# Patient Record
Sex: Male | Born: 1985 | Hispanic: No | Marital: Single | Smoking: Current every day smoker
Health system: Southern US, Community
[De-identification: ages and names within clinical notes are randomized; demographics above are authoritative.]

## PROBLEM LIST (undated history)

## (undated) DIAGNOSIS — F319 Bipolar disorder, unspecified: Secondary | ICD-10-CM

## (undated) DIAGNOSIS — N200 Calculus of kidney: Secondary | ICD-10-CM

## (undated) DIAGNOSIS — F32A Depression, unspecified: Secondary | ICD-10-CM

## (undated) DIAGNOSIS — N1330 Unspecified hydronephrosis: Secondary | ICD-10-CM

## (undated) DIAGNOSIS — M109 Gout, unspecified: Secondary | ICD-10-CM

## (undated) HISTORY — PX: OTHER SURGICAL HISTORY: SHX169

---

## 2015-05-12 DIAGNOSIS — F3164 Bipolar disorder, current episode mixed, severe, with psychotic features: Secondary | ICD-10-CM | POA: Insufficient documentation

## 2015-05-12 DIAGNOSIS — F1210 Cannabis abuse, uncomplicated: Secondary | ICD-10-CM | POA: Insufficient documentation

## 2015-05-12 DIAGNOSIS — F319 Bipolar disorder, unspecified: Secondary | ICD-10-CM | POA: Insufficient documentation

## 2015-08-24 DIAGNOSIS — R404 Transient alteration of awareness: Secondary | ICD-10-CM | POA: Insufficient documentation

## 2015-08-24 DIAGNOSIS — T50901A Poisoning by unspecified drugs, medicaments and biological substances, accidental (unintentional), initial encounter: Secondary | ICD-10-CM | POA: Insufficient documentation

## 2015-11-07 DIAGNOSIS — T50902A Poisoning by unspecified drugs, medicaments and biological substances, intentional self-harm, initial encounter: Secondary | ICD-10-CM | POA: Insufficient documentation

## 2015-11-10 DIAGNOSIS — F3130 Bipolar disorder, current episode depressed, mild or moderate severity, unspecified: Secondary | ICD-10-CM | POA: Insufficient documentation

## 2015-11-17 DIAGNOSIS — S069X9A Unspecified intracranial injury with loss of consciousness of unspecified duration, initial encounter: Secondary | ICD-10-CM | POA: Insufficient documentation

## 2015-11-17 DIAGNOSIS — S069XAA Unspecified intracranial injury with loss of consciousness status unknown, initial encounter: Secondary | ICD-10-CM | POA: Insufficient documentation

## 2017-09-23 ENCOUNTER — Emergency Department
Admission: EM | Admit: 2017-09-23 | Discharge: 2017-09-23 | Disposition: A | Discharge status: Home / Self Care | Payer: Self-pay | Attending: Emergency Medicine | Admitting: Emergency Medicine

## 2017-09-23 ENCOUNTER — Other Ambulatory Visit: Payer: Self-pay

## 2017-09-23 ENCOUNTER — Encounter: Payer: Self-pay | Admitting: Emergency Medicine

## 2017-09-23 DIAGNOSIS — R110 Nausea: Secondary | ICD-10-CM | POA: Insufficient documentation

## 2017-09-23 DIAGNOSIS — R638 Other symptoms and signs concerning food and fluid intake: Secondary | ICD-10-CM | POA: Insufficient documentation

## 2017-09-23 DIAGNOSIS — Z5321 Procedure and treatment not carried out due to patient leaving prior to being seen by health care provider: Secondary | ICD-10-CM | POA: Insufficient documentation

## 2017-09-23 DIAGNOSIS — R109 Unspecified abdominal pain: Secondary | ICD-10-CM | POA: Insufficient documentation

## 2017-09-23 LAB — CBC
HCT: 44.9 % (ref 40.0–52.0)
HEMOGLOBIN: 15.9 g/dL (ref 13.0–18.0)
MCH: 32.8 pg (ref 26.0–34.0)
MCHC: 35.5 g/dL (ref 32.0–36.0)
MCV: 92.5 fL (ref 80.0–100.0)
Platelets: 197 10*3/uL (ref 150–440)
RBC: 4.86 MIL/uL (ref 4.40–5.90)
RDW: 13.4 % (ref 11.5–14.5)
WBC: 7.7 10*3/uL (ref 3.8–10.6)

## 2017-09-23 LAB — COMPREHENSIVE METABOLIC PANEL
ALT: 18 U/L (ref 0–44)
ANION GAP: 8 (ref 5–15)
AST: 30 U/L (ref 15–41)
Albumin: 4.1 g/dL (ref 3.5–5.0)
Alkaline Phosphatase: 70 U/L (ref 38–126)
BUN: 12 mg/dL (ref 6–20)
CALCIUM: 8.9 mg/dL (ref 8.9–10.3)
CO2: 24 mmol/L (ref 22–32)
Chloride: 107 mmol/L (ref 98–111)
Creatinine, Ser: 1.03 mg/dL (ref 0.61–1.24)
GFR calc non Af Amer: >60 mL/min (ref >60)
Glucose, Bld: 93 mg/dL (ref 70–99)
Potassium: 3.8 mmol/L (ref 3.5–5.1)
SODIUM: 139 mmol/L (ref 135–145)
TOTAL PROTEIN: 7.1 g/dL (ref 6.5–8.1)
Total Bilirubin: 0.9 mg/dL (ref 0.3–1.2)

## 2017-09-23 LAB — URINALYSIS, COMPLETE (UACMP) WITH MICROSCOPIC
BILIRUBIN URINE: NEGATIVE
Bacteria, UA: NONE SEEN
GLUCOSE, UA: NEGATIVE mg/dL
HGB URINE DIPSTICK: NEGATIVE
Ketones, ur: NEGATIVE mg/dL
Leukocytes, UA: NEGATIVE
NITRITE: NEGATIVE
PH: 6 (ref 5.0–8.0)
Protein, ur: NEGATIVE mg/dL
Specific Gravity, Urine: 1.023 (ref 1.005–1.030)
Squamous Epithelial / LPF: NONE SEEN (ref 0–5)

## 2017-09-23 LAB — LIPASE, BLOOD: LIPASE: 42 U/L (ref 11–51)

## 2017-09-23 NOTE — ED Triage Notes (Signed)
C/O abdominal pain, initially started one week ago.  Pain returned yesterday, and again today.  C/O LUQ pain.  Denies emesis, but c/o nausea and dry heaving.  Alos decreased appetite

## 2017-09-23 NOTE — ED Notes (Signed)
Not seen in the lobby at this time, no answering to name being called

## 2020-05-12 ENCOUNTER — Encounter (HOSPITAL_COMMUNITY): Payer: Self-pay

## 2020-05-12 ENCOUNTER — Emergency Department (HOSPITAL_COMMUNITY): Payer: Self-pay

## 2020-05-12 ENCOUNTER — Other Ambulatory Visit: Payer: Self-pay

## 2020-05-12 ENCOUNTER — Emergency Department (HOSPITAL_COMMUNITY)
Admission: EM | Admit: 2020-05-12 | Discharge: 2020-05-12 | Disposition: A | Discharge status: Home / Self Care | Payer: Self-pay | Attending: Emergency Medicine | Admitting: Emergency Medicine

## 2020-05-12 DIAGNOSIS — N23 Unspecified renal colic: Secondary | ICD-10-CM | POA: Insufficient documentation

## 2020-05-12 DIAGNOSIS — N201 Calculus of ureter: Secondary | ICD-10-CM | POA: Insufficient documentation

## 2020-05-12 DIAGNOSIS — F17210 Nicotine dependence, cigarettes, uncomplicated: Secondary | ICD-10-CM | POA: Insufficient documentation

## 2020-05-12 HISTORY — DX: Calculus of kidney: N20.0

## 2020-05-12 HISTORY — DX: Depression, unspecified: F32.A

## 2020-05-12 HISTORY — DX: Unspecified hydronephrosis: N13.30

## 2020-05-12 HISTORY — DX: Bipolar disorder, unspecified: F31.9

## 2020-05-12 HISTORY — DX: Gout, unspecified: M10.9

## 2020-05-12 LAB — CBC WITH DIFFERENTIAL/PLATELET
Abs Immature Granulocytes: 0.02 10*3/uL (ref 0.00–0.07)
Basophils Absolute: 0.1 10*3/uL (ref 0.0–0.1)
Basophils Relative: 1 %
Eosinophils Absolute: 0.3 10*3/uL (ref 0.0–0.5)
Eosinophils Relative: 3 %
HCT: 46.6 % (ref 39.0–52.0)
Hemoglobin: 15.7 g/dL (ref 13.0–17.0)
Immature Granulocytes: 0 %
Lymphocytes Relative: 42 %
Lymphs Abs: 3.9 10*3/uL (ref 0.7–4.0)
MCH: 31.5 pg (ref 26.0–34.0)
MCHC: 33.7 g/dL (ref 30.0–36.0)
MCV: 93.6 fL (ref 80.0–100.0)
Monocytes Absolute: 0.7 10*3/uL (ref 0.1–1.0)
Monocytes Relative: 8 %
Neutro Abs: 4.2 10*3/uL (ref 1.7–7.7)
Neutrophils Relative %: 46 %
Platelets: 256 10*3/uL (ref 150–400)
RBC: 4.98 MIL/uL (ref 4.22–5.81)
RDW: 13.3 % (ref 11.5–15.5)
WBC: 9.2 10*3/uL (ref 4.0–10.5)
nRBC: 0 % (ref 0.0–0.2)

## 2020-05-12 LAB — URINALYSIS, MICROSCOPIC (REFLEX)
Bacteria, UA: NONE SEEN
RBC / HPF: >50 RBC/hpf (ref 0–5)
Squamous Epithelial / HPF: NONE SEEN (ref 0–5)

## 2020-05-12 LAB — COMPREHENSIVE METABOLIC PANEL
ALT: 16 U/L (ref 0–44)
AST: 19 U/L (ref 15–41)
Albumin: 4.1 g/dL (ref 3.5–5.0)
Alkaline Phosphatase: 69 U/L (ref 38–126)
Anion gap: 9 (ref 5–15)
BUN: 14 mg/dL (ref 6–20)
CO2: 24 mmol/L (ref 22–32)
Calcium: 8.9 mg/dL (ref 8.9–10.3)
Chloride: 106 mmol/L (ref 98–111)
Creatinine, Ser: 0.98 mg/dL (ref 0.61–1.24)
GFR, Estimated: >60 mL/min (ref >60)
Glucose, Bld: 114 mg/dL — ABNORMAL HIGH (ref 70–99)
Potassium: 3.8 mmol/L (ref 3.5–5.1)
Sodium: 139 mmol/L (ref 135–145)
Total Bilirubin: 0.5 mg/dL (ref 0.3–1.2)
Total Protein: 7 g/dL (ref 6.5–8.1)

## 2020-05-12 LAB — URINALYSIS, ROUTINE W REFLEX MICROSCOPIC
Bilirubin Urine: NEGATIVE
Glucose, UA: NEGATIVE mg/dL
Ketones, ur: NEGATIVE mg/dL
Nitrite: NEGATIVE
Protein, ur: NEGATIVE mg/dL
Specific Gravity, Urine: 1.025 (ref 1.005–1.030)
pH: 6 (ref 5.0–8.0)

## 2020-05-12 LAB — LIPASE, BLOOD: Lipase: 37 U/L (ref 11–51)

## 2020-05-12 MED ORDER — OXYCODONE-ACETAMINOPHEN 5-325 MG PO TABS: 1.0000 | ORAL_TABLET | ORAL | 0 refills | Status: DC | PRN

## 2020-05-12 MED ORDER — ONDANSETRON HCL 4 MG/2ML IJ SOLN
4.0000 mg | Freq: Once | INTRAMUSCULAR | Status: AC
  Administered 2020-05-12: 4 mg via INTRAVENOUS
  Filled 2020-05-12: qty 2

## 2020-05-12 MED ORDER — MORPHINE SULFATE (PF) 4 MG/ML IV SOLN
4.0000 mg | Freq: Once | INTRAVENOUS | Status: AC
  Administered 2020-05-12: 4 mg via INTRAVENOUS
  Filled 2020-05-12: qty 1

## 2020-05-12 MED ORDER — LACTATED RINGERS IV BOLUS
1000.0000 mL | Freq: Once | INTRAVENOUS | Status: AC
  Administered 2020-05-12: 1000 mL via INTRAVENOUS

## 2020-05-12 MED ORDER — ONDANSETRON HCL 4 MG PO TABS: 4.0000 mg | ORAL_TABLET | Freq: Four times a day (QID) | ORAL | 0 refills | Status: DC | PRN

## 2020-05-12 MED ORDER — KETOROLAC TROMETHAMINE 30 MG/ML IJ SOLN
30.0000 mg | Freq: Once | INTRAMUSCULAR | Status: AC
  Administered 2020-05-12: 30 mg via INTRAVENOUS
  Filled 2020-05-12: qty 1

## 2020-05-12 MED ORDER — TAMSULOSIN HCL 0.4 MG PO CAPS: 0.4000 mg | ORAL_CAPSULE | Freq: Every day | ORAL | 0 refills | Status: DC

## 2020-05-12 MED ORDER — OXYCODONE HCL 5 MG PO TABS: 5.0000 mg | ORAL_TABLET | ORAL | 0 refills | Status: DC | PRN

## 2020-05-12 NOTE — ED Provider Notes (Signed)
[facility] EMERGENCY DEPARTMENT Provider Note   CSN: [id number] Arrival date & time: 05/12/20  3235   History   James Shelton is a 35 y.o. male.  The history is provided by the patient.    History reviewed. No pertinent past medical history.  There are no problems to display for this patient. He has a history of kidney stones and comes in complaining of left flank pain which woke him from sleep.  Pain does not radiate.  There is associated nausea but no vomiting.  Pain is similar to what he has had with kidney stones in the past.  Nothing makes pain better, nothing makes it worse.  Denies fever or chills.  Denies any difficulty urinating, but does state he noted some blood in his urine before going to bed last night.  Past Surgical History:  Procedure Laterality Date  . arm surgery         History reviewed. No pertinent family history.  Social History   Tobacco Use  . Smoking status: Current Every Day Smoker    Types: Cigarettes  . Smokeless tobacco: Never Used  Substance Use Topics  . Alcohol use: Yes    Comment: rarely  . Drug use: Yes    Types: Marijuana    Home Medications Prior to Admission medications   Not on File    Allergies    Patient has no known allergies.  Review of Systems   Review of Systems  All other systems reviewed and are negative.   Physical Exam Updated Vital Signs BP (!) 140/94 (BP Location: Right Arm)   Pulse 66   Temp 97.9 F (36.6 C) (Oral)   Resp 18   Ht 5\' 11"  (1.803 m)   Wt 79.4 kg   SpO2 99%   BMI 24.41 kg/m   Physical Exam Vitals and nursing note reviewed.   35 year old male, in obvious pain, but in no acute distress. Vital signs are significant for elevated blood pressure. Oxygen saturation is 99%, which is normal. Head is normocephalic and atraumatic. PERRLA, EOMI. Oropharynx is clear. Neck is nontender and supple without adenopathy or JVD. Back is nontender and there is no CVA tenderness. Lungs are clear  without rales, wheezes, or rhonchi. Chest is nontender. Heart has regular rate and rhythm without murmur. Abdomen is soft, flat, with mild left lower quadrant tenderness.  There is no rebound or guarding.  There are no masses or hepatosplenomegaly and peristalsis is hypoactive. Extremities have no cyanosis or edema, full range of motion is present. Skin is warm and dry without rash. Neurologic: Mental status is normal, cranial nerves are intact, there are no motor or sensory deficits.   ED Results / Procedures / Treatments   Labs (all labs ordered are listed, but only abnormal results are displayed) Labs Reviewed  COMPREHENSIVE METABOLIC PANEL - Abnormal; Notable for the following components:      Result Value   Glucose, Bld 114 (*)    All other components within normal limits  LIPASE, BLOOD  CBC WITH DIFFERENTIAL/PLATELET  URINALYSIS, ROUTINE W REFLEX MICROSCOPIC   Radiology CT Renal Stone Study  Result Date: 05/12/2020 CLINICAL DATA:  Left flank pain. EXAM: CT ABDOMEN AND PELVIS WITHOUT CONTRAST TECHNIQUE: Multidetector CT imaging of the abdomen and pelvis was performed following the standard protocol without IV contrast. COMPARISON:  None. FINDINGS: Lower chest: No acute abnormality. Hepatobiliary: No focal liver abnormality is seen. No gallstones, gallbladder wall thickening, or biliary dilatation. Pancreas: Unremarkable. No pancreatic ductal  dilatation or surrounding inflammatory changes. Spleen: Normal in size without focal abnormality. Adrenals/Urinary Tract: Adrenal glands are unremarkable. Kidneys are normal in size without focal lesions. A 4 mm obstructing renal stone is seen within the proximal left ureter with moderate severity left-sided hydronephrosis. A 3 mm calcification is seen projecting over the expected region of the mid right ureter, without associated hydronephrosis or hydroureter (axial CT image 48, CT series number 2). Bladder is unremarkable. Stomach/Bowel: Stomach is  within normal limits. Appendix appears normal. No evidence of bowel wall thickening, distention, or inflammatory changes. A solitary noninflamed diverticulum is seen within the proximal descending colon. Vascular/Lymphatic: No significant vascular findings are present. No enlarged abdominal or pelvic lymph nodes. Reproductive: Prostate is unremarkable. Other: No abdominal wall hernia or abnormality. No abdominopelvic ascites. Musculoskeletal: No acute or significant osseous findings. IMPRESSION: 1. 4 mm obstructing renal stone within the proximal left ureter. 2. Additional 3 mm renal stone suspected within the mid right ureter, as described above. Electronically Signed   By: Aram Candela M.D.   On: 05/12/2020 03:20    Procedures Procedures   Medications Ordered in ED Medications  ketorolac (TORADOL) 30 MG/ML injection 30 mg (has no administration in time range)  morphine 4 MG/ML injection 4 mg (has no administration in time range)  ondansetron (ZOFRAN) injection 4 mg (has no administration in time range)  lactated ringers bolus 1,000 mL (has no administration in time range)    ED Course  I have reviewed the triage vital signs and the nursing notes.  Pertinent labs & imaging results that were available during my care of the patient were reviewed by me and considered in my medical decision making (see chart for details).  MDM Rules/Calculators/A&P Left flank pain concerning for renal colic.  Other diagnostic possibilities include pancreatitis, peptic ulcer disease, diverticulitis, pyelonephritis.  This differential includes conditions with significant morbidity and mortality.  Old records are reviewed, and he has several prior ED visits for kidney stones.  CT abdomen and pelvis without contrast on 04/06/2020 did show mild right hydronephrosis with a 5.3 mm calculus in the proximal right ureter.  He will be given IV fluids, ketorolac, morphine, ondansetron and will be sent for renal stone  protocol CT scan.  CT scan shows 4 mm calculus in the left ureter with hydronephrosis, also probable 3 mm calculus in the right mid ureter without hydronephrosis.  He had partial relief of pain with above-noted treatment, and will be given additional morphine.  He got adequate relief with additional morphine.  He is discharged with prescriptions for oxycodone, tamsulosin, ondansetron and is referred to urology for follow-up.  Return precautions discussed.  Final Clinical Impression(s) / ED Diagnoses Final diagnoses:  Ureteral colic  Ureterolithiasis    Rx / DC Orders ED Discharge Orders         Ordered    oxyCODONE-acetaminophen (PERCOCET) 5-325 MG tablet  Every 4 hours PRN        05/12/20 0456    oxyCODONE (ROXICODONE) 5 MG immediate release tablet  Every 4 hours PRN        05/12/20 0456    ondansetron (ZOFRAN) 4 MG tablet  Every 6 hours PRN        05/12/20 0456    tamsulosin (FLOMAX) 0.4 MG CAPS capsule  Daily        05/12/20 0456           Dione Booze, MD 05/12/20 [phone]

## 2020-05-12 NOTE — Discharge Instructions (Addendum)
Return if your running a fever, if you are vomiting and cannot hold your medication down, or if pain is not being adequately controlled at home.

## 2020-05-12 NOTE — ED Triage Notes (Signed)
Blood in urine since last night. Left flank pain since this am. Hx of kidney stones.

## 2020-05-13 MED FILL — Oxycodone w/ Acetaminophen Tab 5-325 MG: ORAL | Qty: 6 | Status: AC

## 2020-05-22 ENCOUNTER — Inpatient Hospital Stay (HOSPITAL_COMMUNITY): Payer: Self-pay

## 2020-05-22 ENCOUNTER — Observation Stay (HOSPITAL_COMMUNITY)
Admission: EM | Admit: 2020-05-22 | Discharge: 2020-05-23 | Disposition: A | Discharge status: Home / Self Care | Payer: Self-pay | Attending: Urology | Admitting: Urology

## 2020-05-22 ENCOUNTER — Encounter (HOSPITAL_COMMUNITY)
Admission: EM | Disposition: A | Discharge status: Home / Self Care | Payer: Self-pay | Source: Home / Self Care | Attending: Urology

## 2020-05-22 ENCOUNTER — Inpatient Hospital Stay (HOSPITAL_COMMUNITY): Payer: Self-pay | Admitting: Certified Registered Nurse Anesthetist

## 2020-05-22 ENCOUNTER — Emergency Department (HOSPITAL_COMMUNITY): Payer: Self-pay

## 2020-05-22 ENCOUNTER — Other Ambulatory Visit: Payer: Self-pay

## 2020-05-22 ENCOUNTER — Encounter (HOSPITAL_COMMUNITY): Payer: Self-pay

## 2020-05-22 DIAGNOSIS — N201 Calculus of ureter: Secondary | ICD-10-CM | POA: Diagnosis present

## 2020-05-22 DIAGNOSIS — N1330 Unspecified hydronephrosis: Secondary | ICD-10-CM

## 2020-05-22 DIAGNOSIS — Z20822 Contact with and (suspected) exposure to covid-19: Secondary | ICD-10-CM | POA: Insufficient documentation

## 2020-05-22 DIAGNOSIS — F17210 Nicotine dependence, cigarettes, uncomplicated: Secondary | ICD-10-CM | POA: Insufficient documentation

## 2020-05-22 DIAGNOSIS — N3001 Acute cystitis with hematuria: Secondary | ICD-10-CM | POA: Insufficient documentation

## 2020-05-22 DIAGNOSIS — N132 Hydronephrosis with renal and ureteral calculous obstruction: Principal | ICD-10-CM | POA: Insufficient documentation

## 2020-05-22 HISTORY — PX: CYSTOSCOPY W/ URETERAL STENT PLACEMENT: SHX1429

## 2020-05-22 LAB — BASIC METABOLIC PANEL
Anion gap: 8 (ref 5–15)
BUN: 13 mg/dL (ref 6–20)
CO2: 24 mmol/L (ref 22–32)
Calcium: 8.7 mg/dL — ABNORMAL LOW (ref 8.9–10.3)
Chloride: 106 mmol/L (ref 98–111)
Creatinine, Ser: 1.04 mg/dL (ref 0.61–1.24)
GFR, Estimated: >60 mL/min (ref >60)
Glucose, Bld: 71 mg/dL (ref 70–99)
Potassium: 3.9 mmol/L (ref 3.5–5.1)
Sodium: 138 mmol/L (ref 135–145)

## 2020-05-22 LAB — RESP PANEL BY RT-PCR (FLU A&B, COVID) ARPGX2
Influenza A by PCR: NEGATIVE
Influenza B by PCR: NEGATIVE
SARS Coronavirus 2 by RT PCR: NEGATIVE

## 2020-05-22 LAB — CBC
HCT: 50.4 % (ref 39.0–52.0)
Hemoglobin: 16.6 g/dL (ref 13.0–17.0)
MCH: 31.3 pg (ref 26.0–34.0)
MCHC: 32.9 g/dL (ref 30.0–36.0)
MCV: 94.9 fL (ref 80.0–100.0)
Platelets: 239 10*3/uL (ref 150–400)
RBC: 5.31 MIL/uL (ref 4.22–5.81)
RDW: 13.2 % (ref 11.5–15.5)
WBC: 7.8 10*3/uL (ref 4.0–10.5)
nRBC: 0 % (ref 0.0–0.2)

## 2020-05-22 LAB — COMPREHENSIVE METABOLIC PANEL
ALT: 14 U/L (ref 0–44)
AST: 17 U/L (ref 15–41)
Albumin: 4.2 g/dL (ref 3.5–5.0)
Alkaline Phosphatase: 74 U/L (ref 38–126)
Anion gap: 8 (ref 5–15)
BUN: 13 mg/dL (ref 6–20)
CO2: 23 mmol/L (ref 22–32)
Calcium: 9.2 mg/dL (ref 8.9–10.3)
Chloride: 108 mmol/L (ref 98–111)
Creatinine, Ser: 1.06 mg/dL (ref 0.61–1.24)
GFR, Estimated: >60 mL/min (ref >60)
Glucose, Bld: 108 mg/dL — ABNORMAL HIGH (ref 70–99)
Potassium: 4 mmol/L (ref 3.5–5.1)
Sodium: 139 mmol/L (ref 135–145)
Total Bilirubin: 0.8 mg/dL (ref 0.3–1.2)
Total Protein: 7.3 g/dL (ref 6.5–8.1)

## 2020-05-22 LAB — CBC WITH DIFFERENTIAL/PLATELET
Abs Immature Granulocytes: 0.03 10*3/uL (ref 0.00–0.07)
Basophils Absolute: 0.1 10*3/uL (ref 0.0–0.1)
Basophils Relative: 1 %
Eosinophils Absolute: 0.3 10*3/uL (ref 0.0–0.5)
Eosinophils Relative: 2 %
HCT: 49.4 % (ref 39.0–52.0)
Hemoglobin: 16.5 g/dL (ref 13.0–17.0)
Immature Granulocytes: 0 %
Lymphocytes Relative: 29 %
Lymphs Abs: 3.1 10*3/uL (ref 0.7–4.0)
MCH: 31.1 pg (ref 26.0–34.0)
MCHC: 33.4 g/dL (ref 30.0–36.0)
MCV: 93.2 fL (ref 80.0–100.0)
Monocytes Absolute: 1 10*3/uL (ref 0.1–1.0)
Monocytes Relative: 9 %
Neutro Abs: 6.2 10*3/uL (ref 1.7–7.7)
Neutrophils Relative %: 59 %
Platelets: 289 10*3/uL (ref 150–400)
RBC: 5.3 MIL/uL (ref 4.22–5.81)
RDW: 13.1 % (ref 11.5–15.5)
WBC: 10.6 10*3/uL — ABNORMAL HIGH (ref 4.0–10.5)
nRBC: 0 % (ref 0.0–0.2)

## 2020-05-22 LAB — URINALYSIS, ROUTINE W REFLEX MICROSCOPIC
Glucose, UA: 100 mg/dL — AB
Ketones, ur: NEGATIVE mg/dL
Nitrite: POSITIVE — AB
Protein, ur: 100 mg/dL — AB
Specific Gravity, Urine: 1.025 (ref 1.005–1.030)
pH: 6.5 (ref 5.0–8.0)

## 2020-05-22 LAB — URINALYSIS, MICROSCOPIC (REFLEX)
RBC / HPF: >50 RBC/hpf (ref 0–5)
Squamous Epithelial / HPF: NONE SEEN (ref 0–5)

## 2020-05-22 LAB — LIPASE, BLOOD: Lipase: 33 U/L (ref 11–51)

## 2020-05-22 SURGERY — CYSTOSCOPY, WITH RETROGRADE PYELOGRAM AND URETERAL STENT INSERTION
Anesthesia: General | Site: Ureter | Laterality: Bilateral

## 2020-05-22 MED ORDER — HYDROMORPHONE HCL 1 MG/ML IJ SOLN
1.0000 mg | Freq: Once | INTRAMUSCULAR | Status: AC
Start: 2020-05-22 — End: 2020-05-22
  Administered 2020-05-22: 1 mg via INTRAVENOUS
  Filled 2020-05-22: qty 1

## 2020-05-22 MED ORDER — OXYCODONE-ACETAMINOPHEN 5-325 MG PO TABS: 1.0000 | ORAL_TABLET | ORAL | Status: DC | PRN

## 2020-05-22 MED ORDER — GLYCOPYRROLATE PF 0.2 MG/ML IJ SOSY
PREFILLED_SYRINGE | INTRAMUSCULAR | Status: AC
  Filled 2020-05-22: qty 3

## 2020-05-22 MED ORDER — STERILE WATER FOR IRRIGATION IR SOLN
Status: DC | PRN
  Administered 2020-05-22: 500 mL

## 2020-05-22 MED ORDER — DIATRIZOATE MEGLUMINE 30 % UR SOLN
URETHRAL | Status: AC
  Filled 2020-05-22: qty 100

## 2020-05-22 MED ORDER — LIDOCAINE HCL (PF) 2 % IJ SOLN
INTRAMUSCULAR | Status: AC
  Filled 2020-05-22: qty 5

## 2020-05-22 MED ORDER — ZOLPIDEM TARTRATE 5 MG PO TABS: 5.0000 mg | ORAL_TABLET | Freq: Every evening | ORAL | Status: DC | PRN

## 2020-05-22 MED ORDER — GLYCOPYRROLATE 0.2 MG/ML IJ SOLN
INTRAMUSCULAR | Status: DC | PRN
  Administered 2020-05-22: .1 mg via INTRAVENOUS

## 2020-05-22 MED ORDER — LIDOCAINE HCL (CARDIAC) PF 100 MG/5ML IV SOSY
PREFILLED_SYRINGE | INTRAVENOUS | Status: DC | PRN
  Administered 2020-05-22: 40 mg via INTRAVENOUS

## 2020-05-22 MED ORDER — ONDANSETRON HCL 4 MG/2ML IJ SOLN: 4.0000 mg | Freq: Once | INTRAMUSCULAR | Status: DC | PRN

## 2020-05-22 MED ORDER — SODIUM CHLORIDE 0.9 % IV SOLN
1.0000 g | Freq: Once | INTRAVENOUS | Status: AC
  Administered 2020-05-22: 1 g via INTRAVENOUS
  Filled 2020-05-22: qty 10

## 2020-05-22 MED ORDER — SODIUM CHLORIDE 0.9 % IR SOLN
Status: DC | PRN
  Administered 2020-05-22: 3000 mL via INTRAVESICAL

## 2020-05-22 MED ORDER — FENTANYL CITRATE (PF) 100 MCG/2ML IJ SOLN
INTRAMUSCULAR | Status: AC
  Filled 2020-05-22: qty 2

## 2020-05-22 MED ORDER — SODIUM CHLORIDE 0.9 % IV SOLN
2.0000 g | INTRAVENOUS | Status: DC
  Administered 2020-05-23: 2 g via INTRAVENOUS
  Filled 2020-05-22: qty 20

## 2020-05-22 MED ORDER — KETOROLAC TROMETHAMINE 30 MG/ML IJ SOLN
30.0000 mg | Freq: Once | INTRAMUSCULAR | Status: AC
  Administered 2020-05-22: 30 mg via INTRAVENOUS
  Filled 2020-05-22: qty 1

## 2020-05-22 MED ORDER — PROPOFOL 10 MG/ML IV BOLUS
INTRAVENOUS | Status: DC | PRN
  Administered 2020-05-22: 200 mg via INTRAVENOUS

## 2020-05-22 MED ORDER — DIATRIZOATE MEGLUMINE 30 % UR SOLN
URETHRAL | Status: DC | PRN
  Administered 2020-05-22: 12 mL via URETHRAL

## 2020-05-22 MED ORDER — DIPHENHYDRAMINE HCL 12.5 MG/5ML PO ELIX: 12.5000 mg | ORAL_SOLUTION | Freq: Four times a day (QID) | ORAL | Status: DC | PRN

## 2020-05-22 MED ORDER — CHLORHEXIDINE GLUCONATE 0.12 % MT SOLN
15.0000 mL | Freq: Once | OROMUCOSAL | Status: AC
  Administered 2020-05-22: 15 mL via OROMUCOSAL

## 2020-05-22 MED ORDER — OXYBUTYNIN CHLORIDE 5 MG PO TABS
5.0000 mg | ORAL_TABLET | Freq: Three times a day (TID) | ORAL | Status: DC | PRN
  Administered 2020-05-22: 5 mg via ORAL
  Filled 2020-05-22: qty 1

## 2020-05-22 MED ORDER — MORPHINE SULFATE (PF) 4 MG/ML IV SOLN
4.0000 mg | Freq: Once | INTRAVENOUS | Status: AC
  Administered 2020-05-22: 4 mg via INTRAVENOUS
  Filled 2020-05-22: qty 1

## 2020-05-22 MED ORDER — PROPOFOL 10 MG/ML IV BOLUS
INTRAVENOUS | Status: AC
  Filled 2020-05-22: qty 20

## 2020-05-22 MED ORDER — HYDROMORPHONE HCL 1 MG/ML IJ SOLN: 0.2500 mg | INTRAMUSCULAR | Status: DC | PRN

## 2020-05-22 MED ORDER — ONDANSETRON HCL 4 MG/2ML IJ SOLN
INTRAMUSCULAR | Status: DC | PRN
  Administered 2020-05-22: 4 mg via INTRAVENOUS

## 2020-05-22 MED ORDER — HYDROMORPHONE HCL 1 MG/ML IJ SOLN
0.5000 mg | INTRAMUSCULAR | Status: DC | PRN
  Administered 2020-05-22 (×2): 1 mg via INTRAVENOUS
  Filled 2020-05-22 (×2): qty 1

## 2020-05-22 MED ORDER — SODIUM CHLORIDE 0.9 % IV SOLN: INTRAVENOUS | Status: DC

## 2020-05-22 MED ORDER — LACTATED RINGERS IV SOLN: INTRAVENOUS | Status: DC

## 2020-05-22 MED ORDER — ORAL CARE MOUTH RINSE: 15.0000 mL | Freq: Once | OROMUCOSAL | Status: AC

## 2020-05-22 MED ORDER — ACETAMINOPHEN 325 MG PO TABS
650.0000 mg | ORAL_TABLET | ORAL | Status: DC | PRN
  Administered 2020-05-22: 650 mg via ORAL
  Filled 2020-05-22: qty 2

## 2020-05-22 MED ORDER — MIDAZOLAM HCL 2 MG/2ML IJ SOLN
INTRAMUSCULAR | Status: DC | PRN
  Administered 2020-05-22: 2 mg via INTRAVENOUS

## 2020-05-22 MED ORDER — ONDANSETRON HCL 4 MG/2ML IJ SOLN: 4.0000 mg | INTRAMUSCULAR | Status: DC | PRN

## 2020-05-22 MED ORDER — FENTANYL CITRATE (PF) 250 MCG/5ML IJ SOLN
INTRAMUSCULAR | Status: DC | PRN
  Administered 2020-05-22: 50 ug via INTRAVENOUS

## 2020-05-22 MED ORDER — MIDAZOLAM HCL 2 MG/2ML IJ SOLN
INTRAMUSCULAR | Status: AC
  Filled 2020-05-22: qty 2

## 2020-05-22 MED ORDER — ONDANSETRON HCL 4 MG/2ML IJ SOLN
4.0000 mg | Freq: Once | INTRAMUSCULAR | Status: AC
  Administered 2020-05-22: 4 mg via INTRAVENOUS
  Filled 2020-05-22: qty 2

## 2020-05-22 MED ORDER — DIPHENHYDRAMINE HCL 50 MG/ML IJ SOLN
12.5000 mg | Freq: Four times a day (QID) | INTRAMUSCULAR | Status: DC | PRN
Start: 2020-05-22 — End: 2020-05-23

## 2020-05-22 MED ORDER — ONDANSETRON HCL 4 MG/2ML IJ SOLN
INTRAMUSCULAR | Status: AC
  Filled 2020-05-22: qty 2

## 2020-05-22 SURGICAL SUPPLY — 20 items
BAG DRAIN URO TABLE W/ADPT NS (BAG) ×2 IMPLANT
BAG DRN 8 ADPR NS SKTRN CSTL (BAG) ×1
BAG HAMPER (MISCELLANEOUS) ×2 IMPLANT
CATH INTERMIT  6FR 70CM (CATHETERS) ×2 IMPLANT
CLOTH BEACON ORANGE TIMEOUT ST (SAFETY) ×2 IMPLANT
DECANTER SPIKE VIAL GLASS SM (MISCELLANEOUS) ×2 IMPLANT
GLOVE BIO SURGEON STRL SZ8 (GLOVE) ×2 IMPLANT
GLOVE SURG UNDER POLY LF SZ7 (GLOVE) ×4 IMPLANT
GOWN STRL REUS W/TWL LRG LVL3 (GOWN DISPOSABLE) ×2 IMPLANT
GOWN STRL REUS W/TWL XL LVL3 (GOWN DISPOSABLE) ×2 IMPLANT
GUIDEWIRE STR ZIPWIRE 035X150 (MISCELLANEOUS) ×2 IMPLANT
IV NS IRRIG 3000ML ARTHROMATIC (IV SOLUTION) ×4 IMPLANT
KIT TURNOVER CYSTO (KITS) ×2 IMPLANT
MANIFOLD NEPTUNE II (INSTRUMENTS) ×2 IMPLANT
PACK CYSTO (CUSTOM PROCEDURE TRAY) ×2 IMPLANT
PAD ARMBOARD 7.5X6 YLW CONV (MISCELLANEOUS) ×4 IMPLANT
STENT URET 6FRX26 CONTOUR (STENTS) ×4 IMPLANT
SYR 10ML LL (SYRINGE) ×2 IMPLANT
TOWEL OR 17X26 4PK STRL BLUE (TOWEL DISPOSABLE) ×2 IMPLANT
WATER STERILE IRR 500ML POUR (IV SOLUTION) ×2 IMPLANT

## 2020-05-22 NOTE — Anesthesia Procedure Notes (Signed)
Procedure Name: LMA Insertion Date/Time: 05/22/2020 12:48 PM Performed by: Lorin Glass, CRNA Pre-anesthesia Checklist: Patient identified, Emergency Drugs available, Suction available and Patient being monitored Patient Re-evaluated:Patient Re-evaluated prior to induction Oxygen Delivery Method: Circle system utilized Preoxygenation: Pre-oxygenation with 100% oxygen Induction Type: IV induction LMA: LMA inserted LMA Size: 4.0 Tube secured with: Tape Dental Injury: Teeth and Oropharynx as per pre-operative assessment

## 2020-05-22 NOTE — Op Note (Signed)
Preoperative diagnosis: bilateral ureteral calculi  Postoperative diagnosis: Same  Procedure: 1 cystoscopy 2. Bilateral retrograde pyelography 3.  Intraoperative fluoroscopy, under one hour, with interpretation 4.  bilateral 6 x 26 JJ stent placement  Attending: Wilkie Aye  Anesthesia: General  Estimated blood loss: None  Drains: bilateral 6 x 26 JJ ureteral stent without tether  Specimens: none  Antibiotics: rocephin  Findings: bilateral ureteral stones. moderate bilateral hydronephrosis. No masses/lesions in the bladder. Ureteral orifices in normal anatomic location.  Indications: Patient is a 35 year old male with a history of bilateral ureteral stone and pyuria. After discussing treatment options, they decided proceed with bilateral stent placement.  Procedure her in detail: The patient was brought to the operating room and a brief timeout was done to ensure correct patient, correct procedure, correct site.  General anesthesia was administered patient was placed in dorsal lithotomy position.  Her genitalia was then prepped and draped in usual sterile fashion.  A rigid 22 French cystoscope was passed in the urethra and the bladder.  Bladder was inspected free masses or lesions.  the ureteral orifices were in the normal orthotopic locations. a 6 french ureteral catheter was then instilled into the left ureteral orifice.  a gentle retrograde was obtained and findings noted above. We then advanced a zipwire up to the renal pelvis. We then placed a 6 x 26 double-j ureteral stent over the zip wire. We then removed the wire and good coil was noted in the the renal pelvis under fluoroscopy and the bladder under direct vision.  We then turned out attention to the right side.  a gentle retrograde was obtained and findings noted above. We then advanced a zipwire up to the renal pelvis. we then placed a 6 x 26 double-j ureteral stent over the original zip wire.  We then removed the wire and  good coil was noted in the the renal pelvis under fluoroscopy and the bladder under direct vision.  the bladder was then drained and this concluded the procedure which was well tolerated by patient.  Complications: None  Condition: Stable, extubated, transferred to PACU  Plan: Patient is to be admitted for IV antibiotics

## 2020-05-22 NOTE — Transfer of Care (Signed)
Immediate Anesthesia Transfer of Care Note  Patient: James Shelton  Procedure(s) Performed: CYSTOSCOPY WITH RETROGRADE PYELOGRAM/URETERAL STENT PLACEMENT (Bilateral Ureter)  Patient Location: PACU  Anesthesia Type:General  Level of Consciousness: drowsy  Airway & Oxygen Therapy: Patient Spontanous Breathing and Patient connected to nasal cannula oxygen  Post-op Assessment: Report given to RN and Post -op Vital signs reviewed and stable  Post vital signs: Reviewed and stable  Last Vitals:  Vitals Value Taken Time  BP 95/64   Temp    Pulse 45   Resp 10   SpO2 97     Last Pain:  Vitals:   05/22/20 1151  TempSrc: Oral  PainSc: 0-No pain         Complications: No complications documented.

## 2020-05-22 NOTE — Anesthesia Preprocedure Evaluation (Signed)
Anesthesia Evaluation  Patient identified by MRN, date of birth, ID band Patient awake    Reviewed: Allergy & Precautions, H&P , NPO status , Patient's Chart, lab work & pertinent test results, reviewed documented beta blocker date and time   Airway Mallampati: II  TM Distance: >3 FB Neck ROM: full    Dental no notable dental hx.    Pulmonary neg pulmonary ROS, Current Smoker,    Pulmonary exam normal breath sounds clear to auscultation       Cardiovascular Exercise Tolerance: Good negative cardio ROS   Rhythm:regular Rate:Normal     Neuro/Psych PSYCHIATRIC DISORDERS Depression Bipolar Disorder negative neurological ROS     GI/Hepatic negative GI ROS, Neg liver ROS,   Endo/Other  negative endocrine ROS  Renal/GU Renal disease  negative genitourinary   Musculoskeletal   Abdominal   Peds  Hematology negative hematology ROS (+)   Anesthesia Other Findings   Reproductive/Obstetrics negative OB ROS                             Anesthesia Physical  Anesthesia Plan  ASA: II  Anesthesia Plan: General   Post-op Pain Management:    Induction:   PONV Risk Score and Plan: Ondansetron  Airway Management Planned:   Additional Equipment:   Intra-op Plan:   Post-operative Plan:   Informed Consent: I have reviewed the patients History and Physical, chart, labs and discussed the procedure including the risks, benefits and alternatives for the proposed anesthesia with the patient or authorized representative who has indicated his/her understanding and acceptance.     Dental Advisory Given  Plan Discussed with: CRNA  Anesthesia Plan Comments:         Anesthesia Quick Evaluation  

## 2020-05-22 NOTE — ED Notes (Signed)
Patient resting comfortably - checked on patient due to low pulse rate.

## 2020-05-22 NOTE — H&P (View-Only) (Signed)
Urology Consult  Referring physician: Dr. Jacqulyn Bath Reason for referral: Bilateral ureteral calculi  Chief Complaint: bilateral flank pain  History of Present Illness: Mr Soy is a 35yo who presented to the ER with worsening bilateral flank pain and nausea/vomiting. He was originally seen on 05/12/2020 which bilateral ureteral calculi and was discharged and referred to urology as an outpatient. The pain worsened yesterday and he presented back to the ER. Repeat CT shows bilateral ureteral calculi. He is having hematuria but no other LUTS. No fevers. UA shows many bacteria  Past Medical History:  Diagnosis Date  . Bipolar 1 disorder (HCC)   . Depression   . Gout   . Hydronephrosis   . Kidney stone    Past Surgical History:  Procedure Laterality Date  . arm surgery      Medications: I have reviewed the patient's current medications. Allergies:  Allergies  Allergen Reactions  . Depakote [Valproic Acid]     History reviewed. No pertinent family history. Social History:  reports that he has been smoking cigarettes. He has never used smokeless tobacco. He reports current alcohol use. He reports current drug use. Drug: Marijuana.  Review of Systems  Genitourinary: Positive for flank pain.  All other systems reviewed and are negative.   Physical Exam:  Vital signs in last 24 hours: Temp:  [97.9 F (36.6 C)] 97.9 F (36.6 C) (04/21 0504) Pulse Rate:  [66-76] 66 (04/21 0600) Resp:  [22] 22 (04/21 0505) BP: (138-152)/(91-117) 138/91 (04/21 0506) SpO2:  [95 %-97 %] 95 % (04/21 0600) Weight:  [79.4 kg] 79.4 kg (04/21 0504) Physical Exam Vitals reviewed.  Constitutional:      Appearance: Normal appearance.  HENT:     Head: Normocephalic and atraumatic.     Nose: Nose normal. No congestion.  Eyes:     Extraocular Movements: Extraocular movements intact.     Pupils: Pupils are equal, round, and reactive to light.  Cardiovascular:     Rate and Rhythm: Normal rate and regular  rhythm.  Pulmonary:     Effort: Pulmonary effort is normal. No respiratory distress.  Abdominal:     General: Abdomen is flat. There is no distension.  Musculoskeletal:        General: No swelling. Normal range of motion.     Cervical back: Normal range of motion and neck supple.  Skin:    General: Skin is warm and dry.  Neurological:     General: No focal deficit present.     Mental Status: He is alert and oriented to person, place, and time.  Psychiatric:        Mood and Affect: Mood normal.        Behavior: Behavior normal.        Thought Content: Thought content normal.        Judgment: Judgment normal.     Laboratory Data:  Results for orders placed or performed during the hospital encounter of 05/22/20 (from the past 72 hour(s))  Urinalysis, Routine w reflex microscopic Urine, Clean Catch     Status: Abnormal   Collection Time: 05/22/20  5:15 AM  Result Value Ref Range   Color, Urine BROWN (A) YELLOW    Comment: BIOCHEMICALS MAY BE AFFECTED BY COLOR   APPearance HAZY (A) CLEAR   Specific Gravity, Urine 1.025 1.005 - 1.030   pH 6.5 5.0 - 8.0   Glucose, UA 100 (A) NEGATIVE mg/dL   Hgb urine dipstick LARGE (A) NEGATIVE   Bilirubin Urine SMALL (A)  NEGATIVE   Ketones, ur NEGATIVE NEGATIVE mg/dL   Protein, ur 606 (A) NEGATIVE mg/dL   Nitrite POSITIVE (A) NEGATIVE   Leukocytes,Ua TRACE (A) NEGATIVE    Comment: Performed at [facility], [address] [place], [place] [address]  Comprehensive metabolic panel     Status: Abnormal   Collection Time: 05/22/20  5:15 AM  Result Value Ref Range   Sodium 139 135 - 145 mmol/L   Potassium 4.0 3.5 - 5.1 mmol/L   Chloride 108 98 - 111 mmol/L   CO2 23 22 - 32 mmol/L   Glucose, Bld 108 (H) 70 - 99 mg/dL    Comment: Glucose reference range applies only to samples taken after fasting for at least 8 hours.   BUN 13 6 - 20 mg/dL   Creatinine, Ser 1.09 0.61 - 1.24 mg/dL   Calcium 9.2 8.9 - 32.3 mg/dL   Total Protein 7.3 6.5 - 8.1 g/dL    Albumin 4.2 3.5 - 5.0 g/dL   AST 17 15 - 41 U/L   ALT 14 0 - 44 U/L   Alkaline Phosphatase 74 38 - 126 U/L   Total Bilirubin 0.8 0.3 - 1.2 mg/dL   GFR, Estimated >55 >73 mL/min    Comment: (NOTE) Calculated using the CKD-EPI Creatinine Equation (2021)    Anion gap 8 5 - 15    Comment: Performed at [facility], [address] [place], [place] [address]  CBC with Differential     Status: Abnormal   Collection Time: 05/22/20  5:15 AM  Result Value Ref Range   WBC 10.6 (H) 4.0 - 10.5 K/uL   RBC 5.30 4.22 - 5.81 MIL/uL   Hemoglobin 16.5 13.0 - 17.0 g/dL   HCT 42.7 06.2 - 37.6 %   MCV 93.2 80.0 - 100.0 fL   MCH 31.1 26.0 - 34.0 pg   MCHC 33.4 30.0 - 36.0 g/dL   RDW 28.3 15.1 - 76.1 %   Platelets 289 150 - 400 K/uL   nRBC 0.0 0.0 - 0.2 %   Neutrophils Relative % 59 %   Neutro Abs 6.2 1.7 - 7.7 K/uL   Lymphocytes Relative 29 %   Lymphs Abs 3.1 0.7 - 4.0 K/uL   Monocytes Relative 9 %   Monocytes Absolute 1.0 0.1 - 1.0 K/uL   Eosinophils Relative 2 %   Eosinophils Absolute 0.3 0.0 - 0.5 K/uL   Basophils Relative 1 %   Basophils Absolute 0.1 0.0 - 0.1 K/uL   Immature Granulocytes 0 %   Abs Immature Granulocytes 0.03 0.00 - 0.07 K/uL    Comment: Performed at [facility], [address], [place], [place] [address]  Lipase, blood     Status: None   Collection Time: 05/22/20  5:15 AM  Result Value Ref Range   Lipase 33 11 - 51 U/L    Comment: Performed at [facility], [address], [place], [place] [address]  Urinalysis, Microscopic (reflex)     Status: Abnormal   Collection Time: 05/22/20  5:15 AM  Result Value Ref Range   RBC / HPF >50 0 - 5 RBC/hpf   WBC, UA 0-5 0 - 5 WBC/hpf   Bacteria, UA MANY (A) NONE SEEN   Squamous Epithelial / LPF NONE SEEN 0 - 5    Comment: Performed at [facility], [address], [place], [place] [address]  Resp Panel by RT-PCR (Flu A&B, Covid) Nasopharyngeal Swab     Status: None   Collection Time: 05/22/20  6:50 AM   Specimen: Nasopharyngeal  Swab; Nasopharyngeal(NP) swabs in vial transport medium  Result Value Ref Range   SARS Coronavirus 2 by RT PCR NEGATIVE NEGATIVE    Comment: (NOTE) SARS-CoV-2 target nucleic acids are NOT DETECTED.  The SARS-CoV-2 RNA is generally detectable in upper respiratory specimens during the acute phase of infection. The lowest concentration of SARS-CoV-2 viral copies this assay can detect is 138 copies/mL. A negative result does not preclude SARS-Cov-2 infection and should not be used as the sole basis for treatment or other patient management decisions. A negative result may occur with  improper specimen collection/handling, submission of specimen other than nasopharyngeal swab, presence of viral mutation(s) within the areas targeted by this assay, and inadequate number of viral copies(<138 copies/mL). A negative result must be combined with clinical observations, patient history, and epidemiological information. The expected result is Negative.  Fact Sheet for Patients:  https://www.fda.gov/media/152166/download  Fact Sheet for Healthcare Providers:  https://www.fda.gov/media/152162/download  This test is no t yet approved or cleared by the [place] FDA and  has been authorized for detection and/or diagnosis of SARS-CoV-2 by FDA under an Emergency Use Authorization (EUA). This EUA will remain  in effect (meaning this test can be used) for the duration of the COVID-19 declaration under Section 564(b)(1) of the Act, 21 U.S.C.section 360bbb-3(b)(1), unless the authorization is terminated  or revoked sooner.       Influenza A by PCR NEGATIVE NEGATIVE   Influenza B by PCR NEGATIVE NEGATIVE    Comment: (NOTE) The Xpert Xpress SARS-CoV-2/FLU/RSV plus assay is intended as an aid in the diagnosis of influenza from Nasopharyngeal swab specimens and should not be used as a sole basis for treatment. Nasal washings and aspirates are unacceptable for Xpert Xpress  SARS-CoV-2/FLU/RSV testing.  Fact Sheet for Patients: https://www.fda.gov/media/152166/download  Fact Sheet for Healthcare Providers: https://www.fda.gov/media/152162/download  This test is not yet approved or cleared by the [place] FDA and has been authorized for detection and/or diagnosis of SARS-CoV-2 by FDA under an Emergency Use Authorization (EUA). This EUA will remain in effect (meaning this test can be used) for the duration of the COVID-19 declaration under Section 564(b)(1) of the Act, 21 U.S.C. section 360bbb-3(b)(1), unless the authorization is terminated or revoked.  Performed at [facility], [address] [place], [place] [address]    Recent Results (from the past 240 hour(s))  Resp Panel by RT-PCR (Flu A&B, Covid) Nasopharyngeal Swab     Status: None   Collection Time: 05/22/20  6:50 AM   Specimen: Nasopharyngeal Swab; Nasopharyngeal(NP) swabs in vial transport medium  Result Value Ref Range Status   SARS Coronavirus 2 by RT PCR NEGATIVE NEGATIVE Final    Comment: (NOTE) SARS-CoV-2 target nucleic acids are NOT DETECTED.  The SARS-CoV-2 RNA is generally detectable in upper respiratory specimens during the acute phase of infection. The lowest concentration of SARS-CoV-2 viral copies this assay can detect is 138 copies/mL. A negative result does not preclude SARS-Cov-2 infection and should not be used as the sole basis for treatment or other patient management decisions. A negative result may occur with  improper specimen collection/handling, submission of specimen other than nasopharyngeal swab, presence of viral mutation(s) within the areas targeted by this assay, and inadequate number of viral copies(<138 copies/mL). A negative result must be combined with clinical observations, patient history, and epidemiological information. The expected result is Negative.  Fact Sheet for Patients:  https://www.fda.gov/media/152166/download  Fact Sheet for  Healthcare Providers:  https://www.fda.gov/media/152162/download  This test   is no t yet approved or cleared by the [place] and  has been authorized for detection and/or diagnosis of SARS-CoV-2 by FDA under an Emergency Use Authorization (EUA). This EUA will remain  in effect (meaning this test can be used) for the duration of the COVID-19 declaration under Section 564(b)(1) of the Act, 21 U.S.C.section 360bbb-3(b)(1), unless the authorization is terminated  or revoked sooner.       Influenza A by PCR NEGATIVE NEGATIVE Final   Influenza B by PCR NEGATIVE NEGATIVE Final    Comment: (NOTE) The Xpert Xpress SARS-CoV-2/FLU/RSV plus assay is intended as an aid in the diagnosis of influenza from Nasopharyngeal swab specimens and should not be used as a sole basis for treatment. Nasal washings and aspirates are unacceptable for Xpert Xpress SARS-CoV-2/FLU/RSV testing.  Fact Sheet for Patients: BloggerCourse.com  Fact Sheet for Healthcare Providers: SeriousBroker.it  This test is not yet approved or cleared by the [place] FDA and has been authorized for detection and/or diagnosis of SARS-CoV-2 by FDA under an Emergency Use Authorization (EUA). This EUA will remain in effect (meaning this test can be used) for the duration of the COVID-19 declaration under Section 564(b)(1) of the Act, 21 U.S.C. section 360bbb-3(b)(1), unless the authorization is terminated or revoked.  Performed at [facility], [address], [place], [place] [address]    Creatinine: Recent Labs    05/22/20 0515  CREATININE 1.06   Baseline Creatinine: 1  Impression/Assessment:  35yo with bilateral ureteral calculi  Plan:  -We discussed the management of kidney stones. These options include observation, ureteroscopy, shockwave lithotripsy (ESWL) and percutaneous nephrolithotomy (PCNL). We discussed which options are relevant to the patient's  stone(s). We discussed the natural history of kidney stones as well as the complications of untreated stones and the impact on quality of life without treatment as well as with each of the above listed treatments. We also discussed the efficacy of each treatment in its ability to clear the stone burden. With any of these management options I discussed the signs and symptoms of infection and the need for emergent treatment should these be experienced. For each option we discussed the ability of each procedure to clear the patient of their stone burden.   For observation I described the risks which include but are not limited to silent renal damage, life-threatening infection, need for emergent surgery, failure to pass stone and pain.   For ureteroscopy I described the risks which include bleeding, infection, damage to contiguous structures, positioning injury, ureteral stricture, ureteral avulsion, ureteral injury, need for prolonged ureteral stent, inability to perform ureteroscopy, need for an interval procedure, inability to clear stone burden, stent discomfort/pain, heart attack, stroke, pulmonary embolus and the inherent risks with general anesthesia.   For shockwave lithotripsy I described the risks which include arrhythmia, kidney contusion, kidney hemorrhage, need for transfusion, pain, inability to adequately break up stone, inability to pass stone fragments, Steinstrasse, infection associated with obstructing stones, need for alternate surgical procedure, need for repeat shockwave lithotripsy, MI, CVA, PE and the inherent risks with anesthesia/conscious sedation.   For PCNL I described the risks including positioning injury, pneumothorax, hydrothorax, need for chest tube, inability to clear stone burden, renal laceration, arterial venous fistula or malformation, need for embolization of kidney, loss of kidney or renal function, need for repeat procedure, need for prolonged nephrostomy tube, ureteral  avulsion, MI, CVA, PE and the inherent risks of general anesthesia.   - The patient would like to proceed with  bilateral ureteral stent placement  Wilkie Ayeatrick Keyna Blizard 05/22/2020, 8:23 AM

## 2020-05-22 NOTE — Anesthesia Postprocedure Evaluation (Signed)
Anesthesia Post Note  Patient: James Shelton  Procedure(s) Performed: CYSTOSCOPY WITH RETROGRADE PYELOGRAM/URETERAL STENT PLACEMENT (Bilateral Ureter)  Patient location during evaluation: Phase II Anesthesia Type: General Level of consciousness: awake Pain management: pain level controlled Vital Signs Assessment: post-procedure vital signs reviewed and stable Respiratory status: spontaneous breathing and respiratory function stable Cardiovascular status: blood pressure returned to baseline and stable Postop Assessment: no headache and no apparent nausea or vomiting Anesthetic complications: no Comments: Late entry   No complications documented.   Last Vitals:  Vitals:   05/22/20 1430 05/22/20 1445  BP: 123/86 134/81  Pulse: (!) 42   Resp: 10 18  Temp:  36.5 C  SpO2: 100% 100%    Last Pain:  Vitals:   05/22/20 1430  TempSrc:   PainSc: 0-No pain                 Windell Norfolk

## 2020-05-22 NOTE — ED Triage Notes (Signed)
Pt reports R sided flank pain. Hx of kidney stones.

## 2020-05-22 NOTE — Consult Note (Signed)
Urology Consult  Referring physician: Dr. Jacqulyn Bath Reason for referral: Bilateral ureteral calculi  Chief Complaint: bilateral flank pain  History of Present Illness: [name] is a 35yo who presented to the ER with worsening bilateral flank pain and nausea/vomiting. He was originally seen on 05/12/2020 which bilateral ureteral calculi and was discharged and referred to urology as an outpatient. The pain worsened yesterday and he presented back to the ER. Repeat CT shows bilateral ureteral calculi. He is having hematuria but no other LUTS. No fevers. UA shows many bacteria  Past Medical History:  Diagnosis Date  . Bipolar 1 disorder (HCC)   . Depression   . Gout   . Hydronephrosis   . Kidney stone    Past Surgical History:  Procedure Laterality Date  . arm surgery      Medications: I have reviewed the patient's current medications. Allergies:  Allergies  Allergen Reactions  . Depakote [Valproic Acid]     History reviewed. No pertinent family history. Social History:  reports that he has been smoking cigarettes. He has never used smokeless tobacco. He reports current alcohol use. He reports current drug use. Drug: Marijuana.  Review of Systems  Genitourinary: Positive for flank pain.  All other systems reviewed and are negative.   Physical Exam:  Vital signs in last 24 hours: Temp:  [97.9 F (36.6 C)] 97.9 F (36.6 C) (04/21 0504) Pulse Rate:  [66-76] 66 (04/21 0600) Resp:  [22] 22 (04/21 0505) BP: (138-152)/(91-117) 138/91 (04/21 0506) SpO2:  [95 %-97 %] 95 % (04/21 0600) Weight:  [79.4 kg] 79.4 kg (04/21 0504) Physical Exam Vitals reviewed.  Constitutional:      Appearance: Normal appearance.  HENT:     Head: Normocephalic and atraumatic.     Nose: Nose normal. No congestion.  Eyes:     Extraocular Movements: Extraocular movements intact.     Pupils: Pupils are equal, round, and reactive to light.  Cardiovascular:     Rate and Rhythm: Normal rate and regular  rhythm.  Pulmonary:     Effort: Pulmonary effort is normal. No respiratory distress.  Abdominal:     General: Abdomen is flat. There is no distension.  Musculoskeletal:        General: No swelling. Normal range of motion.     Cervical back: Normal range of motion and neck supple.  Skin:    General: Skin is warm and dry.  Neurological:     General: No focal deficit present.     Mental Status: He is alert and oriented to person, place, and time.  Psychiatric:        Mood and Affect: Mood normal.        Behavior: Behavior normal.        Thought Content: Thought content normal.        Judgment: Judgment normal.     Laboratory Data:  Results for orders placed or performed during the hospital encounter of 05/22/20 (from the past 72 hour(s))  Urinalysis, Routine w reflex microscopic Urine, Clean Catch     Status: Abnormal   Collection Time: 05/22/20  5:15 AM  Result Value Ref Range   Color, Urine BROWN (A) YELLOW    Comment: BIOCHEMICALS MAY BE AFFECTED BY COLOR   APPearance HAZY (A) CLEAR   Specific Gravity, Urine 1.025 1.005 - 1.030   pH 6.5 5.0 - 8.0   Glucose, UA 100 (A) NEGATIVE mg/dL   Hgb urine dipstick LARGE (A) NEGATIVE   Bilirubin Urine SMALL (A)  NEGATIVE   Ketones, ur NEGATIVE NEGATIVE mg/dL   Protein, ur 606 (A) NEGATIVE mg/dL   Nitrite POSITIVE (A) NEGATIVE   Leukocytes,Ua TRACE (A) NEGATIVE    Comment: Performed at [facility], [address] [place], [place] [address]  Comprehensive metabolic panel     Status: Abnormal   Collection Time: 05/22/20  5:15 AM  Result Value Ref Range   Sodium 139 135 - 145 mmol/L   Potassium 4.0 3.5 - 5.1 mmol/L   Chloride 108 98 - 111 mmol/L   CO2 23 22 - 32 mmol/L   Glucose, Bld 108 (H) 70 - 99 mg/dL    Comment: Glucose reference range applies only to samples taken after fasting for at least 8 hours.   BUN 13 6 - 20 mg/dL   Creatinine, Ser 1.09 0.61 - 1.24 mg/dL   Calcium 9.2 8.9 - 32.3 mg/dL   Total Protein 7.3 6.5 - 8.1 g/dL    Albumin 4.2 3.5 - 5.0 g/dL   AST 17 15 - 41 U/L   ALT 14 0 - 44 U/L   Alkaline Phosphatase 74 38 - 126 U/L   Total Bilirubin 0.8 0.3 - 1.2 mg/dL   GFR, Estimated >55 >73 mL/min    Comment: (NOTE) Calculated using the CKD-EPI Creatinine Equation (2021)    Anion gap 8 5 - 15    Comment: Performed at [facility], [address] [place], [place] [address]  CBC with Differential     Status: Abnormal   Collection Time: 05/22/20  5:15 AM  Result Value Ref Range   WBC 10.6 (H) 4.0 - 10.5 K/uL   RBC 5.30 4.22 - 5.81 MIL/uL   Hemoglobin 16.5 13.0 - 17.0 g/dL   HCT 42.7 06.2 - 37.6 %   MCV 93.2 80.0 - 100.0 fL   MCH 31.1 26.0 - 34.0 pg   MCHC 33.4 30.0 - 36.0 g/dL   RDW 28.3 15.1 - 76.1 %   Platelets 289 150 - 400 K/uL   nRBC 0.0 0.0 - 0.2 %   Neutrophils Relative % 59 %   Neutro Abs 6.2 1.7 - 7.7 K/uL   Lymphocytes Relative 29 %   Lymphs Abs 3.1 0.7 - 4.0 K/uL   Monocytes Relative 9 %   Monocytes Absolute 1.0 0.1 - 1.0 K/uL   Eosinophils Relative 2 %   Eosinophils Absolute 0.3 0.0 - 0.5 K/uL   Basophils Relative 1 %   Basophils Absolute 0.1 0.0 - 0.1 K/uL   Immature Granulocytes 0 %   Abs Immature Granulocytes 0.03 0.00 - 0.07 K/uL    Comment: Performed at [facility], [address], [place], [place] [address]  Lipase, blood     Status: None   Collection Time: 05/22/20  5:15 AM  Result Value Ref Range   Lipase 33 11 - 51 U/L    Comment: Performed at [facility], [address], [place], [place] [address]  Urinalysis, Microscopic (reflex)     Status: Abnormal   Collection Time: 05/22/20  5:15 AM  Result Value Ref Range   RBC / HPF >50 0 - 5 RBC/hpf   WBC, UA 0-5 0 - 5 WBC/hpf   Bacteria, UA MANY (A) NONE SEEN   Squamous Epithelial / LPF NONE SEEN 0 - 5    Comment: Performed at [facility], [address], [place], [place] [address]  Resp Panel by RT-PCR (Flu A&B, Covid) Nasopharyngeal Swab     Status: None   Collection Time: 05/22/20  6:50 AM   Specimen: Nasopharyngeal  Swab; Nasopharyngeal(NP) swabs in vial transport medium  Result Value Ref Range   SARS Coronavirus 2 by RT PCR NEGATIVE NEGATIVE    Comment: (NOTE) SARS-CoV-2 target nucleic acids are NOT DETECTED.  The SARS-CoV-2 RNA is generally detectable in upper respiratory specimens during the acute phase of infection. The lowest concentration of SARS-CoV-2 viral copies this assay can detect is 138 copies/mL. A negative result does not preclude SARS-Cov-2 infection and should not be used as the sole basis for treatment or other patient management decisions. A negative result may occur with  improper specimen collection/handling, submission of specimen other than nasopharyngeal swab, presence of viral mutation(s) within the areas targeted by this assay, and inadequate number of viral copies(<138 copies/mL). A negative result must be combined with clinical observations, patient history, and epidemiological information. The expected result is Negative.  Fact Sheet for Patients:  BloggerCourse.com  Fact Sheet for Healthcare Providers:  SeriousBroker.it  This test is no t yet approved or cleared by the [place] FDA and  has been authorized for detection and/or diagnosis of SARS-CoV-2 by FDA under an Emergency Use Authorization (EUA). This EUA will remain  in effect (meaning this test can be used) for the duration of the COVID-19 declaration under Section 564(b)(1) of the Act, 21 U.S.C.section 360bbb-3(b)(1), unless the authorization is terminated  or revoked sooner.       Influenza A by PCR NEGATIVE NEGATIVE   Influenza B by PCR NEGATIVE NEGATIVE    Comment: (NOTE) The Xpert Xpress SARS-CoV-2/FLU/RSV plus assay is intended as an aid in the diagnosis of influenza from Nasopharyngeal swab specimens and should not be used as a sole basis for treatment. Nasal washings and aspirates are unacceptable for Xpert Xpress  SARS-CoV-2/FLU/RSV testing.  Fact Sheet for Patients: BloggerCourse.com  Fact Sheet for Healthcare Providers: SeriousBroker.it  This test is not yet approved or cleared by the [place] FDA and has been authorized for detection and/or diagnosis of SARS-CoV-2 by FDA under an Emergency Use Authorization (EUA). This EUA will remain in effect (meaning this test can be used) for the duration of the COVID-19 declaration under Section 564(b)(1) of the Act, 21 U.S.C. section 360bbb-3(b)(1), unless the authorization is terminated or revoked.  Performed at [facility], [address] [place], [place] [address]    Recent Results (from the past 240 hour(s))  Resp Panel by RT-PCR (Flu A&B, Covid) Nasopharyngeal Swab     Status: None   Collection Time: 05/22/20  6:50 AM   Specimen: Nasopharyngeal Swab; Nasopharyngeal(NP) swabs in vial transport medium  Result Value Ref Range Status   SARS Coronavirus 2 by RT PCR NEGATIVE NEGATIVE Final    Comment: (NOTE) SARS-CoV-2 target nucleic acids are NOT DETECTED.  The SARS-CoV-2 RNA is generally detectable in upper respiratory specimens during the acute phase of infection. The lowest concentration of SARS-CoV-2 viral copies this assay can detect is 138 copies/mL. A negative result does not preclude SARS-Cov-2 infection and should not be used as the sole basis for treatment or other patient management decisions. A negative result may occur with  improper specimen collection/handling, submission of specimen other than nasopharyngeal swab, presence of viral mutation(s) within the areas targeted by this assay, and inadequate number of viral copies(<138 copies/mL). A negative result must be combined with clinical observations, patient history, and epidemiological information. The expected result is Negative.  Fact Sheet for Patients:  BloggerCourse.com  Fact Sheet for  Healthcare Providers:  SeriousBroker.it  This test  is no t yet approved or cleared by the [place] and  has been authorized for detection and/or diagnosis of SARS-CoV-2 by FDA under an Emergency Use Authorization (EUA). This EUA will remain  in effect (meaning this test can be used) for the duration of the COVID-19 declaration under Section 564(b)(1) of the Act, 21 U.S.C.section 360bbb-3(b)(1), unless the authorization is terminated  or revoked sooner.       Influenza A by PCR NEGATIVE NEGATIVE Final   Influenza B by PCR NEGATIVE NEGATIVE Final    Comment: (NOTE) The Xpert Xpress SARS-CoV-2/FLU/RSV plus assay is intended as an aid in the diagnosis of influenza from Nasopharyngeal swab specimens and should not be used as a sole basis for treatment. Nasal washings and aspirates are unacceptable for Xpert Xpress SARS-CoV-2/FLU/RSV testing.  Fact Sheet for Patients: BloggerCourse.com  Fact Sheet for Healthcare Providers: SeriousBroker.it  This test is not yet approved or cleared by the [place] FDA and has been authorized for detection and/or diagnosis of SARS-CoV-2 by FDA under an Emergency Use Authorization (EUA). This EUA will remain in effect (meaning this test can be used) for the duration of the COVID-19 declaration under Section 564(b)(1) of the Act, 21 U.S.C. section 360bbb-3(b)(1), unless the authorization is terminated or revoked.  Performed at [facility], [address], [place], [place] [address]    Creatinine: Recent Labs    05/22/20 0515  CREATININE 1.06   Baseline Creatinine: 1  Impression/Assessment:  35yo with bilateral ureteral calculi  Plan:  -We discussed the management of kidney stones. These options include observation, ureteroscopy, shockwave lithotripsy (ESWL) and percutaneous nephrolithotomy (PCNL). We discussed which options are relevant to the patient's  stone(s). We discussed the natural history of kidney stones as well as the complications of untreated stones and the impact on quality of life without treatment as well as with each of the above listed treatments. We also discussed the efficacy of each treatment in its ability to clear the stone burden. With any of these management options I discussed the signs and symptoms of infection and the need for emergent treatment should these be experienced. For each option we discussed the ability of each procedure to clear the patient of their stone burden.   For observation I described the risks which include but are not limited to silent renal damage, life-threatening infection, need for emergent surgery, failure to pass stone and pain.   For ureteroscopy I described the risks which include bleeding, infection, damage to contiguous structures, positioning injury, ureteral stricture, ureteral avulsion, ureteral injury, need for prolonged ureteral stent, inability to perform ureteroscopy, need for an interval procedure, inability to clear stone burden, stent discomfort/pain, heart attack, stroke, pulmonary embolus and the inherent risks with general anesthesia.   For shockwave lithotripsy I described the risks which include arrhythmia, kidney contusion, kidney hemorrhage, need for transfusion, pain, inability to adequately break up stone, inability to pass stone fragments, Steinstrasse, infection associated with obstructing stones, need for alternate surgical procedure, need for repeat shockwave lithotripsy, MI, CVA, PE and the inherent risks with anesthesia/conscious sedation.   For PCNL I described the risks including positioning injury, pneumothorax, hydrothorax, need for chest tube, inability to clear stone burden, renal laceration, arterial venous fistula or malformation, need for embolization of kidney, loss of kidney or renal function, need for repeat procedure, need for prolonged nephrostomy tube, ureteral  avulsion, MI, CVA, PE and the inherent risks of general anesthesia.   - The patient would like to proceed with  bilateral ureteral stent placement  James Shelton 05/22/2020, 8:23 AM

## 2020-05-22 NOTE — ED Provider Notes (Signed)
Emergency Department Provider Note   I have reviewed the triage vital signs and the nursing notes.   HISTORY  Chief Complaint Flank Pain   HPI James Shelton is a 35 y.o. male with PMH reviewed below presents to the emergency department for evaluation of continued abdominal and flank pain now worse on the right side.  Patient was seen in the emergency department on 4/11 with obstructing ureteral stone found on the left.  He was also found to have a right-sided stone although was not causing obstruction radiographically.  He was discharged home with medications but has not been able to get those filled.  He has not followed with urology.  He notes a prior history of kidney stones but none of which have required urologic intervention. Notes some hematuria but denies dysuria, fever, or shaking chills. Pain is severe and now worse on the right.   Past Medical History:  Diagnosis Date  . Bipolar 1 disorder (HCC)   . Depression   . Gout   . Hydronephrosis   . Kidney stone     Patient Active Problem List   Diagnosis Date Noted  . TBI (traumatic brain injury) (HCC) 11/17/2015  . Bipolar affective disorder, current episode depressed (HCC) 11/10/2015  . Suicide attempt by drug ingestion (HCC) 11/07/2015  . Altered consciousness 08/24/2015  . Medication overdose 08/24/2015  . Bipolar affective disorder, current episode severe (HCC) 05/12/2015  . Bipolar I disorder, most recent episode mixed, severe with psychotic features (HCC) 05/12/2015  . Cannabis abuse 05/12/2015    Past Surgical History:  Procedure Laterality Date  . arm surgery      Allergies Depakote [valproic acid]  History reviewed. No pertinent family history.  Social History Social History   Tobacco Use  . Smoking status: Current Every Day Smoker    Types: Cigarettes  . Smokeless tobacco: Never Used  Substance Use Topics  . Alcohol use: Yes    Comment: rarely  . Drug use: Yes    Types: Marijuana    Review  of Systems  Constitutional: No fever/chills Eyes: No visual changes. ENT: No sore throat. Cardiovascular: Denies chest pain. Respiratory: Denies shortness of breath. Gastrointestinal: Right flank and abdominal pain.  Positive nausea, no vomiting.  No diarrhea.  No constipation. Genitourinary: Negative for dysuria. Musculoskeletal: Negative for back pain. Skin: Negative for rash. Neurological: Negative for headaches, focal weakness or numbness.  10-point ROS otherwise negative.  ____________________________________________   PHYSICAL EXAM:  VITAL SIGNS: ED Triage Vitals  Enc Vitals Group     BP 05/22/20 0505 (!) 152/117     Pulse Rate 05/22/20 0505 76     Resp 05/22/20 0505 (!) 22     Temp 05/22/20 0504 97.9 F (36.6 C)     Temp src --      SpO2 05/22/20 0505 97 %     Weight 05/22/20 0504 175 lb (79.4 kg)     Height 05/22/20 0504 5\' 11"  (1.803 m)   Constitutional: Alert and oriented. Appears uncomfortable with frequent shifting in bed.  Eyes: Conjunctivae are normal.  Head: Atraumatic. Nose: No congestion/rhinnorhea. Mouth/Throat: Mucous membranes are moist.  Oropharynx non-erythematous. Neck: No stridor.  No meningeal signs.   Cardiovascular: Normal rate, regular rhythm. Good peripheral circulation. Grossly normal heart sounds.   Respiratory: Normal respiratory effort.  No retractions. Lungs CTAB. Gastrointestinal: Soft and nontender. No distention.  Musculoskeletal:  No gross deformities of extremities. Neurologic:  Normal speech and language.  Skin:  Skin is warm, dry  and intact. No rash noted.   ____________________________________________   LABS (all labs ordered are listed, but only abnormal results are displayed)  Labs Reviewed  URINALYSIS, ROUTINE W REFLEX MICROSCOPIC - Abnormal; Notable for the following components:      Result Value   Color, Urine BROWN (*)    APPearance HAZY (*)    Glucose, UA 100 (*)    Hgb urine dipstick LARGE (*)    Bilirubin  Urine SMALL (*)    Protein, ur 100 (*)    Nitrite POSITIVE (*)    Leukocytes,Ua TRACE (*)    All other components within normal limits  CBC WITH DIFFERENTIAL/PLATELET - Abnormal; Notable for the following components:   WBC 10.6 (*)    All other components within normal limits  URINALYSIS, MICROSCOPIC (REFLEX) - Abnormal; Notable for the following components:   Bacteria, UA MANY (*)    All other components within normal limits  URINE CULTURE  RESP PANEL BY RT-PCR (FLU A&B, COVID) [id number]  COMPREHENSIVE METABOLIC PANEL  LIPASE, BLOOD   ____________________________________________  RADIOLOGY  CT Renal Stone Study  Result Date: 05/22/2020 CLINICAL DATA:  Right-sided flank pain.  History of kidney stones EXAM: CT ABDOMEN AND PELVIS WITHOUT CONTRAST TECHNIQUE: Multidetector CT imaging of the abdomen and pelvis was performed following the standard protocol without IV contrast. 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 Hepatobiliary: No focal liver abnormality.No evidence of biliary obstruction or stone. Pancreas: Unremarkable. Spleen: Unremarkable. Adrenals/Urinary Tract: Negative adrenals. Progression of bilateral nephrolithiasis. A 7 x 4 mm stone is seen at the left UVJ with moderate hydroureteronephrosis. New right hydroureteronephrosis due to a 5 mm stone along the right pelvic sidewall. Punctate left lower pole calculus. Unremarkable bladder Stomach/Bowel:  No obstruction. No visible bowel inflammation. Vascular/Lymphatic: No acute vascular abnormality. No mass or adenopathy. Reproductive:No pathologic findings. Other: No ascites or pneumoperitoneum. Simple lipoma deep to the right external oblique Musculoskeletal: No acute abnormalities. IMPRESSION: Bilateral hydroureteronephrosis due to ureteral calculi. On the left a 7 x 4 mm stone is present at the UVJ and on the right a 5 mm stone is present at the right pelvic sidewall. Electronically Signed    By: Marnee Spring M.D.   On: 05/22/2020 06:33    ____________________________________________   PROCEDURES  Procedure(s) performed:   Procedures  None  ____________________________________________   INITIAL IMPRESSION / ASSESSMENT AND PLAN / ED COURSE  Pertinent labs & imaging results that were available during my care of the patient were reviewed by me and considered in my medical decision making (see chart for details).   Patient returns to the emergency department with worsening pain 10 days after his initial ED evaluation.  At that time he had left ureteral stone which is obstructing but suggestion of a right ureteral stone as well.  Pain today is worse on the right.  Plan for labs, UA, pain control. Will hold on repeat imaging for now with history of ureteral stone, med non-compliance, and similar pain in the past.   06:45 AM  Patient CT showing bilateral hydronephrosis.  He does have significant hematuria on UA but nitrite and bacteria are coming back positive.  I have given a dose of Rocephin.  After Dilaudid his pain is better controlled but has been difficult to control up until this point.  Discussed the case with Dr. Retta Diones on for urology.  He requested the patient be made n.p.o. and will send a COVID PCR.  He  will contact the local urologist here to evaluate for possible intervention this morning.  ____________________________________________  FINAL CLINICAL IMPRESSION(S) / ED DIAGNOSES  Final diagnoses:  Bilateral hydronephrosis  Acute cystitis with hematuria  Ureteral stone     MEDICATIONS GIVEN DURING THIS VISIT:  Medications  cefTRIAXone (ROCEPHIN) 1 g in sodium chloride 0.9 % 100 mL IVPB (1 g Intravenous New Bag/Given 05/22/20 0626)  morphine 4 MG/ML injection 4 mg (4 mg Intravenous Given 05/22/20 0521)  ondansetron (ZOFRAN) injection 4 mg (4 mg Intravenous Given 05/22/20 0521)  ketorolac (TORADOL) 30 MG/ML injection 30 mg (30 mg Intravenous Given 05/22/20  0540)  HYDROmorphone (DILAUDID) injection 1 mg (1 mg Intravenous Given 05/22/20 0354)    Note:  This document was prepared using Dragon voice recognition software and may include unintentional dictation errors.  Alona Bene, MD, [facility] Emergency Medicine    Mindy Gali, Arlyss Repress, MD 05/22/20 [phone]

## 2020-05-23 ENCOUNTER — Encounter (HOSPITAL_COMMUNITY): Payer: Self-pay | Admitting: Urology

## 2020-05-23 LAB — CBC
HCT: 44.5 % (ref 39.0–52.0)
Hemoglobin: 14.8 g/dL (ref 13.0–17.0)
MCH: 31.6 pg (ref 26.0–34.0)
MCHC: 33.3 g/dL (ref 30.0–36.0)
MCV: 94.9 fL (ref 80.0–100.0)
Platelets: 243 10*3/uL (ref 150–400)
RBC: 4.69 MIL/uL (ref 4.22–5.81)
RDW: 13.2 % (ref 11.5–15.5)
WBC: 7.3 10*3/uL (ref 4.0–10.5)
nRBC: 0 % (ref 0.0–0.2)

## 2020-05-23 LAB — BASIC METABOLIC PANEL
Anion gap: 8 (ref 5–15)
BUN: 12 mg/dL (ref 6–20)
CO2: 27 mmol/L (ref 22–32)
Calcium: 8.8 mg/dL — ABNORMAL LOW (ref 8.9–10.3)
Chloride: 105 mmol/L (ref 98–111)
Creatinine, Ser: 1 mg/dL (ref 0.61–1.24)
GFR, Estimated: >60 mL/min (ref >60)
Glucose, Bld: 86 mg/dL (ref 70–99)
Potassium: 4.3 mmol/L (ref 3.5–5.1)
Sodium: 140 mmol/L (ref 135–145)

## 2020-05-23 MED ORDER — SULFAMETHOXAZOLE-TRIMETHOPRIM 800-160 MG PO TABS: 1.0000 | ORAL_TABLET | Freq: Two times a day (BID) | ORAL | 0 refills | Status: AC

## 2020-05-23 MED ORDER — PHENAZOPYRIDINE HCL 100 MG PO TABS: 100.0000 mg | ORAL_TABLET | Freq: Three times a day (TID) | ORAL | 0 refills | Status: AC | PRN

## 2020-05-23 MED ORDER — TAMSULOSIN HCL 0.4 MG PO CAPS: 0.4000 mg | ORAL_CAPSULE | Freq: Every day | ORAL | 0 refills | Status: AC

## 2020-05-23 MED ORDER — OXYCODONE-ACETAMINOPHEN 5-325 MG PO TABS: 1.0000 | ORAL_TABLET | ORAL | 0 refills | Status: DC | PRN

## 2020-05-23 MED ORDER — ONDANSETRON HCL 4 MG PO TABS: 4.0000 mg | ORAL_TABLET | Freq: Three times a day (TID) | ORAL | 0 refills | Status: AC | PRN

## 2020-05-23 NOTE — Progress Notes (Signed)
Discharge instructions reviewed with patient. Patient verbalized understanding of instructions. Patient discharged home in stable condition.  

## 2020-05-23 NOTE — Discharge Instructions (Signed)
Ureteral Stent Implantation, Care After This sheet gives you information about how to care for yourself after your procedure. Your health care provider may also give you more specific instructions. If you have problems or questions, contact your health care provider. What can I expect after the procedure? After the procedure, it is common to have:  Nausea.  Mild pain when you urinate. You may feel this pain in your lower back or lower abdomen. The pain should stop within a few minutes after you urinate. This may last for up to 1 week.  A small amount of blood in your urine for several days. Follow these instructions at home: Medicines  Take over-the-counter and prescription medicines only as told by your health care provider.  If you were prescribed an antibiotic medicine, take it as told by your health care provider. Do not stop taking the antibiotic even if you start to feel better.  Do not drive for 24 hours if you were given a sedative during your procedure.  Ask your health care provider if the medicine prescribed to you requires you to avoid driving or using heavy machinery. Activity  Rest as told by your health care provider.  Avoid sitting for a long time without moving. Get up to take short walks every 1-2 hours. This is important to improve blood flow and breathing. Ask for help if you feel weak or unsteady.  Return to your normal activities as told by your health care provider. Ask your health care provider what activities are safe for you. General instructions  Watch for any blood in your urine. Call your health care provider if the amount of blood in your urine increases.  If you have a catheter: ? Follow instructions from your health care provider about taking care of your catheter and collection bag. ? Do not take baths, swim, or use a hot tub until your health care provider approves. Ask your health care provider if you may take showers. You may only be allowed to  take sponge baths.  Drink enough fluid to keep your urine pale yellow.  Do not use any products that contain nicotine or tobacco, such as cigarettes, e-cigarettes, and chewing tobacco. These can delay healing after surgery. If you need help quitting, ask your health care provider.  Keep all follow-up visits as told by your health care provider. This is important.   Contact a health care provider if:  You have pain that gets worse or does not get better with medicine, especially pain when you urinate.  You have difficulty urinating.  You feel nauseous or you vomit repeatedly during a period of more than 2 days after the procedure. Get help right away if:  Your urine is dark red or has blood clots in it.  You are leaking urine (have incontinence).  The end of the stent comes out of your urethra.  You cannot urinate.  You have sudden, sharp, or severe pain in your abdomen or lower back.  You have a fever.  You have swelling or pain in your legs.  You have difficulty breathing. Summary  After the procedure, it is common to have mild pain when you urinate that goes away within a few minutes after you urinate. This may last for up to 1 week.  Watch for any blood in your urine. Call your health care provider if the amount of blood in your urine increases.  Take over-the-counter and prescription medicines only as told by your health care provider.    Drink enough fluid to keep your urine pale yellow. This information is not intended to replace advice given to you by your health care provider. Make sure you discuss any questions you have with your health care provider. Document Revised: 10/25/2017 Document Reviewed: 10/26/2017 Elsevier Patient Education  2021 [employer]  

## 2020-05-24 ENCOUNTER — Encounter (HOSPITAL_COMMUNITY): Payer: Self-pay | Admitting: Emergency Medicine

## 2020-05-24 ENCOUNTER — Other Ambulatory Visit: Payer: Self-pay

## 2020-05-24 ENCOUNTER — Emergency Department (HOSPITAL_COMMUNITY)
Admission: EM | Admit: 2020-05-24 | Discharge: 2020-05-25 | Disposition: A | Discharge status: Home / Self Care | Payer: Self-pay | Attending: Emergency Medicine | Admitting: Emergency Medicine

## 2020-05-24 DIAGNOSIS — G8918 Other acute postprocedural pain: Secondary | ICD-10-CM | POA: Insufficient documentation

## 2020-05-24 DIAGNOSIS — R1110 Vomiting, unspecified: Secondary | ICD-10-CM | POA: Insufficient documentation

## 2020-05-24 DIAGNOSIS — F17210 Nicotine dependence, cigarettes, uncomplicated: Secondary | ICD-10-CM | POA: Insufficient documentation

## 2020-05-24 LAB — URINE CULTURE: Culture: NO GROWTH

## 2020-05-24 MED ORDER — ONDANSETRON HCL 4 MG/2ML IJ SOLN
4.0000 mg | Freq: Once | INTRAMUSCULAR | Status: AC
  Administered 2020-05-24: 4 mg via INTRAVENOUS
  Filled 2020-05-24: qty 2

## 2020-05-24 MED ORDER — HYDROMORPHONE HCL 1 MG/ML IJ SOLN
1.0000 mg | Freq: Once | INTRAMUSCULAR | Status: AC
Start: 2020-05-24 — End: 2020-05-24
  Administered 2020-05-24: 1 mg via INTRAVENOUS
  Filled 2020-05-24: qty 1

## 2020-05-24 MED ORDER — SODIUM CHLORIDE 0.9 % IV BOLUS
1000.0000 mL | Freq: Once | INTRAVENOUS | Status: AC
  Administered 2020-05-24: 1000 mL via INTRAVENOUS

## 2020-05-24 NOTE — ED Provider Notes (Signed)
[facility] EMERGENCY DEPARTMENT Provider Note   CSN: [id number] Arrival date & time: 05/24/20  2255     History No chief complaint on file.   James Shelton is a 35 y.o. male.  Patient presents with lower abdominal pain and groin pain that onset this evening.  States he had a gradual onset of lower abdominal pain and thought he needed to have a bowel movement.  He tried to have a bowel movement on the toilet for more than 30 minutes and the pain got worse.  Describes lower abdominal pain radiating to both groins.  This is different than his kidney stone pain.  He was admitted to the hospital and discharged yesterday after cystoscopy and bilateral stent placement for kidney stones. He states taking his pain medication and antibiotics as prescribed.  Denies fevers, chills.  Still able to urinate and having hematuria.  Has 2 episodes of vomiting today.  He has not had this kind of pain before.  States has been constipated not had a bowel movement for 4 to 5 days.  Unsure if he is passing gas.  He did not have this pain when he was in the hospital for his hydronephrosis and kidney stones.  Procedure: 1 cystoscopy 2. Bilateral retrograde pyelography 3.  Intraoperative fluoroscopy, under one hour, with interpretation 4.  bilateral 6 x 26 JJ stent placement   The history is provided by the patient.       Past Medical History:  Diagnosis Date  . Bipolar 1 disorder (HCC)   . Depression   . Gout   . Hydronephrosis   . Kidney stone     Patient Active Problem List   Diagnosis Date Noted  . Bilateral ureteral calculi 05/22/2020  . TBI (traumatic brain injury) (HCC) 11/17/2015  . Bipolar affective disorder, current episode depressed (HCC) 11/10/2015  . Suicide attempt by drug ingestion (HCC) 11/07/2015  . Altered consciousness 08/24/2015  . Medication overdose 08/24/2015  . Bipolar affective disorder, current episode severe (HCC) 05/12/2015  . Bipolar I disorder, most recent episode  mixed, severe with psychotic features (HCC) 05/12/2015  . Cannabis abuse 05/12/2015    Past Surgical History:  Procedure Laterality Date  . arm surgery    . CYSTOSCOPY W/ URETERAL STENT PLACEMENT Bilateral 05/22/2020   Procedure: CYSTOSCOPY WITH RETROGRADE PYELOGRAM/URETERAL STENT PLACEMENT;  Surgeon: Malen Gauze, MD;  Location: [facility];  Service: Urology;  Laterality: Bilateral;       History reviewed. No pertinent family history.  Social History   Tobacco Use  . Smoking status: Current Every Day Smoker    Types: Cigarettes  . Smokeless tobacco: Never Used  Substance Use Topics  . Alcohol use: Yes    Comment: rarely  . Drug use: Yes    Types: Marijuana    Home Medications Prior to Admission medications   Medication Sig Start Date End Date Taking? Authorizing Provider  ondansetron (ZOFRAN) 4 MG tablet Take 1 tablet (4 mg total) by mouth every 8 (eight) hours as needed for nausea or vomiting. 05/23/20   McKenzie, Mardene Celeste, MD  oxyCODONE (ROXICODONE) 5 MG immediate release tablet Take 1 tablet (5 mg total) by mouth every 4 (four) hours as needed for severe pain. Patient not taking: Reported on 05/22/2020 05/12/20   Dione Booze, MD  oxyCODONE-acetaminophen (PERCOCET) 5-325 MG tablet Take 1 tablet by mouth every 4 (four) hours as needed for moderate pain. 05/23/20   Malen Gauze, MD  oxyCODONE-acetaminophen (PERCOCET/ROXICET) 5-325 MG tablet Take 1-2  tablets by mouth every 4 (four) hours as needed for moderate pain. 05/23/20   McKenzie, Mardene Celeste, MD  phenazopyridine (PYRIDIUM) 100 MG tablet Take 1 tablet (100 mg total) by mouth 3 (three) times daily as needed for pain. 05/23/20   McKenzie, Mardene Celeste, MD  sulfamethoxazole-trimethoprim (BACTRIM DS) 800-160 MG tablet Take 1 tablet by mouth 2 (two) times daily. 05/23/20   McKenzie, Mardene Celeste, MD  tamsulosin (FLOMAX) 0.4 MG CAPS capsule Take 1 capsule (0.4 mg total) by mouth daily. 05/23/20   McKenzie, Mardene Celeste, MD     Allergies    Depakote [valproic acid]  Review of Systems   Review of Systems  Constitutional: Negative for activity change, appetite change, fatigue and fever.  HENT: Negative for congestion and rhinorrhea.   Respiratory: Negative for cough, chest tightness and shortness of breath.   Cardiovascular: Negative for chest pain.  Gastrointestinal: Positive for abdominal pain, constipation, nausea and vomiting.  Genitourinary: Positive for dysuria, flank pain and hematuria.  Musculoskeletal: Positive for back pain. Negative for arthralgias and myalgias.  Neurological: Negative for dizziness, weakness and headaches.   all other systems are negative except as noted in the HPI and PMH.    Physical Exam Updated Vital Signs BP 129/89 (BP Location: Right Arm)   Pulse 74   Temp 97.9 F (36.6 C) (Oral)   Resp 20   Ht 5\' 11"  (1.803 m)   Wt 79 kg   SpO2 96%   BMI 24.29 kg/m   Physical Exam Vitals and nursing note reviewed.  Constitutional:      General: He is in acute distress.     Appearance: He is well-developed.     Comments: Uncomfortable  HENT:     Head: Normocephalic and atraumatic.     Mouth/Throat:     Pharynx: No oropharyngeal exudate.  Eyes:     Conjunctiva/sclera: Conjunctivae normal.     Pupils: Pupils are equal, round, and reactive to light.  Neck:     Comments: No meningismus. Cardiovascular:     Rate and Rhythm: Normal rate and regular rhythm.     Heart sounds: Normal heart sounds. No murmur heard.   Pulmonary:     Effort: Pulmonary effort is normal. No respiratory distress.     Breath sounds: Normal breath sounds.  Abdominal:     Palpations: Abdomen is soft.     Tenderness: There is abdominal tenderness. There is no guarding or rebound.     Comments: Diffuse lower abdominal tenderness with voluntary guarding.  Genitourinary:    Comments: No testicular tenderness.  No fecal impaction Musculoskeletal:        General: No tenderness. Normal range of  motion.     Cervical back: Normal range of motion and neck supple.     Comments: No CVA tenderness  Skin:    General: Skin is warm.  Neurological:     Mental Status: He is alert and oriented to person, place, and time.     Cranial Nerves: No cranial nerve deficit.     Motor: No abnormal muscle tone.     Coordination: Coordination normal.     Comments: No ataxia on finger to nose bilaterally. No pronator drift. 5/5 strength throughout. CN 2-12 intact.Equal grip strength. Sensation intact.   Psychiatric:        Behavior: Behavior normal.     ED Results / Procedures / Treatments   Labs (all labs ordered are listed, but only abnormal results are displayed) Labs Reviewed  URINALYSIS,  ROUTINE W REFLEX MICROSCOPIC - Abnormal; Notable for the following components:      Result Value   Color, Urine BLOODY (*)    APPearance CLOUDY (*)    Glucose, UA 50 (*)    Hgb urine dipstick LARGE (*)    Ketones, ur 5 (*)    Protein, ur 100 (*)    Nitrite POSITIVE (*)    RBC / HPF >50 (*)    All other components within normal limits  COMPREHENSIVE METABOLIC PANEL - Abnormal; Notable for the following components:   Glucose, Bld 104 (*)    Calcium 8.8 (*)    All other components within normal limits  POC OCCULT BLOOD, ED - Normal  URINE CULTURE  CBC WITH DIFFERENTIAL/PLATELET  LIPASE, BLOOD    EKG None  Radiology CT ABDOMEN PELVIS W CONTRAST  Result Date: 05/25/2020 CLINICAL DATA:  Nonlocalized acute abdominal pain. Bilateral ureteral stent placements. EXAM: CT ABDOMEN AND PELVIS WITH CONTRAST TECHNIQUE: Multidetector CT imaging of the abdomen and pelvis was performed using the standard protocol following bolus administration of intravenous contrast. CONTRAST:  OMNIPAQUE IOHEXOL 300 MG/ML  SOLN COMPARISON:  None. FINDINGS: Lower chest: No acute abnormality. Hepatobiliary: No focal liver abnormality. Layering hyperdensity within the gallbladder lumen. Otherwise no gallbladder wall thickening  or pericholecystic fluid. No biliary dilatation. Pancreas: No focal lesion. Normal pancreatic contour. No surrounding inflammatory changes. No main pancreatic ductal dilatation. Spleen: Normal in size without focal abnormality. Adrenals/Urinary Tract: No adrenal nodule bilaterally. Bilateral ureteral stents with proximal pigtails within the renal pelvis and distal pigtails within the urinary bladder lumen. Slightly delayed right nephrogram. Urothelial thickening of the right collecting system. Slight associated peri-ureteral stranding. No hydronephrosis. Trace right hydroureter. No left hydroureter. No abscess formation. The urinary bladder is unremarkable. On delayed imaging, there is no urothelial wall thickening and there are no filling defects in the opacified portions of the bilateral collecting systems or ureters. Stomach/Bowel: Stomach is within normal limits. No evidence of bowel wall thickening or dilatation. Appendix appears normal. Vascular/Lymphatic: No abdominal aorta or iliac aneurysm. No abdominal, pelvic, or inguinal lymphadenopathy. Reproductive: Prostate is unremarkable. Other: No intraperitoneal free fluid. No intraperitoneal free gas. No organized fluid collection. Musculoskeletal: No abdominal wall hernia or abnormality. No suspicious lytic or blastic osseous lesions. No acute displaced fracture. IMPRESSION: 1. Bilateral ureteral stents in grossly appropriate position with a slightly delayed right nephrogram and suggestion of right collecting system infection. No abscess formation. Correlate with urinalysis. 2. Cholelithiasis with no findings of acute cholecystitis or choledocholithiasis. Electronically Signed   By: Tish Frederickson M.D.   On: 05/25/2020 01:34    Procedures Procedures   Medications Ordered in ED Medications  sodium chloride 0.9 % bolus 1,000 mL (has no administration in time range)  ondansetron (ZOFRAN) injection 4 mg (has no administration in time range)  HYDROmorphone  (DILAUDID) injection 1 mg (has no administration in time range)    ED Course  I have reviewed the triage vital signs and the nursing notes.  Pertinent labs & imaging results that were available during my care of the patient were reviewed by me and considered in my medical decision making (see chart for details).    MDM Rules/Calculators/A&P                         Lower abdominal pain and groin pain after trying to have a bowel movement.  Discharged from the hospital yesterday after bilateral ureteral stent placement.  Uncomfortable with lower abdominal pain and vomiting  Urinalysis today with positive nitrate and red blood cells and white blood cells.  Will send culture.  Patient currently on Bactrim.  Urine culture from April 21 showed no growth Creatinine is at baseline Bladder scan 86 mL.   Pain has improved after medications in the ED.  CT scan is obtained and shows no bowel obstruction or other acute pathology. Ureteral stents in place with some delayed nephrogram of the right collecting system  Discussed with Dr. Alvester MorinBell of urology.  He feels this is likely stent related discomfort or possibly constipation related.  Does not recommend any change to patient's home regimen of Percocet, Flomax and Bactrim.  Continue Bactrim with no need to escalate antibiotics.  Repeat urine culture pending.  Patient pain-free on recheck.  He is tolerating p.o. with a soft abdomen.  Results discussed with him.  Continue Bactrim as well as his pain regimen.  Follow-up with his urologist Dr. Ronne BinningMcKenzie.  We will add MiraLAX for possible component of constipation.  Return to the ED with intractable pain, not able to urinate, fever, vomiting, or any other concerns Final Clinical Impression(s) / ED Diagnoses Final diagnoses:  Postoperative pain    Rx / DC Orders ED Discharge Orders    None       Rhodesia Stanger, Jeannett SeniorStephen, MD 05/25/20 [phone]

## 2020-05-24 NOTE — ED Triage Notes (Signed)
Pt discharged today from hospital after having bilateral ureteral stent placement. Here tonight for increased pain despite taking prescribed pain medication. Pt states he tried to have a BM tonight (hasn't had one in 4 days) and he "thinks he messed something up".

## 2020-05-25 ENCOUNTER — Emergency Department (HOSPITAL_COMMUNITY): Payer: Self-pay

## 2020-05-25 LAB — CBC WITH DIFFERENTIAL/PLATELET
Abs Immature Granulocytes: 0.03 10*3/uL (ref 0.00–0.07)
Basophils Absolute: 0 10*3/uL (ref 0.0–0.1)
Basophils Relative: 0 %
Eosinophils Absolute: 0.2 10*3/uL (ref 0.0–0.5)
Eosinophils Relative: 2 %
HCT: 43.8 % (ref 39.0–52.0)
Hemoglobin: 15.2 g/dL (ref 13.0–17.0)
Immature Granulocytes: 0 %
Lymphocytes Relative: 25 %
Lymphs Abs: 2.5 10*3/uL (ref 0.7–4.0)
MCH: 32.3 pg (ref 26.0–34.0)
MCHC: 34.7 g/dL (ref 30.0–36.0)
MCV: 93 fL (ref 80.0–100.0)
Monocytes Absolute: 0.7 10*3/uL (ref 0.1–1.0)
Monocytes Relative: 7 %
Neutro Abs: 6.6 10*3/uL (ref 1.7–7.7)
Neutrophils Relative %: 66 %
Platelets: 245 10*3/uL (ref 150–400)
RBC: 4.71 MIL/uL (ref 4.22–5.81)
RDW: 13.1 % (ref 11.5–15.5)
WBC: 10.2 10*3/uL (ref 4.0–10.5)
nRBC: 0 % (ref 0.0–0.2)

## 2020-05-25 LAB — URINALYSIS, ROUTINE W REFLEX MICROSCOPIC
Bacteria, UA: NONE SEEN
Bilirubin Urine: NEGATIVE
Glucose, UA: 50 mg/dL — AB
Ketones, ur: 5 mg/dL — AB
Leukocytes,Ua: NEGATIVE
Nitrite: POSITIVE — AB
Protein, ur: 100 mg/dL — AB
RBC / HPF: >50 RBC/hpf — ABNORMAL HIGH (ref 0–5)
Specific Gravity, Urine: 1.019 (ref 1.005–1.030)
pH: 6 (ref 5.0–8.0)

## 2020-05-25 LAB — COMPREHENSIVE METABOLIC PANEL
ALT: 12 U/L (ref 0–44)
AST: 18 U/L (ref 15–41)
Albumin: 4 g/dL (ref 3.5–5.0)
Alkaline Phosphatase: 66 U/L (ref 38–126)
Anion gap: 9 (ref 5–15)
BUN: 12 mg/dL (ref 6–20)
CO2: 24 mmol/L (ref 22–32)
Calcium: 8.8 mg/dL — ABNORMAL LOW (ref 8.9–10.3)
Chloride: 105 mmol/L (ref 98–111)
Creatinine, Ser: 1.22 mg/dL (ref 0.61–1.24)
GFR, Estimated: >60 mL/min (ref >60)
Glucose, Bld: 104 mg/dL — ABNORMAL HIGH (ref 70–99)
Potassium: 3.9 mmol/L (ref 3.5–5.1)
Sodium: 138 mmol/L (ref 135–145)
Total Bilirubin: 0.6 mg/dL (ref 0.3–1.2)
Total Protein: 6.9 g/dL (ref 6.5–8.1)

## 2020-05-25 LAB — LIPASE, BLOOD: Lipase: 31 U/L (ref 11–51)

## 2020-05-25 MED ORDER — POLYETHYLENE GLYCOL 3350 17 G PO PACK: 17.0000 g | PACK | Freq: Every day | ORAL | 0 refills | Status: AC

## 2020-05-25 MED ORDER — KETOROLAC TROMETHAMINE 30 MG/ML IJ SOLN
30.0000 mg | Freq: Once | INTRAMUSCULAR | Status: AC
  Administered 2020-05-25: 30 mg via INTRAVENOUS
  Filled 2020-05-25: qty 1

## 2020-05-25 MED ORDER — IOHEXOL 300 MG/ML  SOLN
100.0000 mL | Freq: Once | INTRAMUSCULAR | Status: AC | PRN
  Administered 2020-05-25: 100 mL via INTRAVENOUS

## 2020-05-25 MED ORDER — HYDROMORPHONE HCL 1 MG/ML IJ SOLN
1.0000 mg | Freq: Once | INTRAMUSCULAR | Status: AC
  Administered 2020-05-25: 1 mg via INTRAVENOUS
  Filled 2020-05-25: qty 1

## 2020-05-25 MED ORDER — SODIUM CHLORIDE 0.9 % IV SOLN
1.0000 g | Freq: Once | INTRAVENOUS | Status: AC
  Administered 2020-05-25: 1 g via INTRAVENOUS
  Filled 2020-05-25: qty 10

## 2020-05-25 NOTE — ED Notes (Signed)
Pt VS updated, pain reported 0/10. Medicated per MAR. Pt attempting a PO fluid challenge at this time.  Pt denies further needs at this time. bed locked and low, call bell within reach. Will continue to monitor.

## 2020-05-25 NOTE — ED Notes (Signed)
Patient transported to CT 

## 2020-05-25 NOTE — Discharge Instructions (Addendum)
Continue to take your medications as prescribed. You should take the miralax for constipation. Continue the antibiotic Bactrim.  You will be called if your urine culture is positive and the medication needs to be changed.  Follow-up with Dr. Ronne Binning as scheduled.  Return to the ED with inability to urinate, fever, vomiting, increased pain, or other concerns.

## 2020-05-25 NOTE — ED Notes (Signed)
VS updated and stable, pt reports pain 0/10. Pt denies further needs at this time. Changed into gown, on monitoring, medicated per MAR. Side rails up x 1, bed locked and low, call bell within reach. Will continue to monitor.

## 2020-05-26 LAB — URINE CULTURE: Culture: NO GROWTH

## 2020-05-29 ENCOUNTER — Telehealth: Payer: Self-pay

## 2020-05-29 NOTE — Patient Instructions (Signed)
SYAIR FRICKER  05/29/2020     @PREFPERIOPPHARMACY @   Your procedure is scheduled on  06/04/2020   Report to [facility] at  1150  A.M.   Call this number if you have problems the morning of surgery:  [phone]   Remember:  Do not eat or drink after midnight.                       Take these medicines the morning of surgery with A SIP OF WATER  zofran (if needed), oxycodone (if needed), pyridium, flomax.   Place clean sheets on your bed the night before your procedure and DO NOT sleep with pets this night,  Shower with CHG the night before and the morning of your procedure. DO NOT use CHG on your face, hair or genitals.  After each shower, dry off with a clean towel, put on clean, comfortable clothes and brush your teeth.      Do not wear jewelry, make-up or nail polish.  Do not wear lotions, powders, or perfumes, or deodorant.  Do not shave 48 hours prior to surgery.  Men may shave face and neck.  Do not bring valuables to the hospital.  [facility] is not responsible for any belongings or valuables.  Contacts, dentures or bridgework may not be worn into surgery.  Leave your suitcase in the car.  After surgery it may be brought to your room.  For patients admitted to the hospital, discharge time will be determined by your treatment team.  Patients discharged the day of surgery will not be allowed to drive home and must have someone with them for 24 hours.    Special instructions:  DO NOT smoke tobacco or vape for 24 hours before your procedure.   Please read over the following fact sheets that you were given. Coughing and Deep Breathing, Surgical Site Infection Prevention, Anesthesia Post-op Instructions and Care and Recovery After Surgery       Ureteral Stent Implantation, Care After This sheet gives you information about how to care for yourself after your procedure. Your health care provider may also give you more specific instructions. If you have  problems or questions, contact your health care provider. What can I expect after the procedure? After the procedure, it is common to have:  Nausea.  Mild pain when you urinate. You may feel this pain in your lower back or lower abdomen. The pain should stop within a few minutes after you urinate. This may last for up to 1 week.  A small amount of blood in your urine for several days. Follow these instructions at home: Medicines  Take over-the-counter and prescription medicines only as told by your health care provider.  If you were prescribed an antibiotic medicine, take it as told by your health care provider. Do not stop taking the antibiotic even if you start to feel better.  Do not drive for 24 hours if you were given a sedative during your procedure.  Ask your health care provider if the medicine prescribed to you requires you to avoid driving or using heavy machinery. Activity  Rest as told by your health care provider.  Avoid sitting for a long time without moving. Get up to take short walks every 1-2 hours. This is important to improve blood flow and breathing. Ask for help if you feel weak or unsteady.  Return to your normal activities as told by your health care  provider. Ask your health care provider what activities are safe for you. General instructions  Watch for any blood in your urine. Call your health care provider if the amount of blood in your urine increases.  If you have a catheter: ? Follow instructions from your health care provider about taking care of your catheter and collection bag. ? Do not take baths, swim, or use a hot tub until your health care provider approves. Ask your health care provider if you may take showers. You may only be allowed to take sponge baths.  Drink enough fluid to keep your urine pale yellow.  Do not use any products that contain nicotine or tobacco, such as cigarettes, e-cigarettes, and chewing tobacco. These can delay healing  after surgery. If you need help quitting, ask your health care provider.  Keep all follow-up visits as told by your health care provider. This is important.   Contact a health care provider if:  You have pain that gets worse or does not get better with medicine, especially pain when you urinate.  You have difficulty urinating.  You feel nauseous or you vomit repeatedly during a period of more than 2 days after the procedure. Get help right away if:  Your urine is dark red or has blood clots in it.  You are leaking urine (have incontinence).  The end of the stent comes out of your urethra.  You cannot urinate.  You have sudden, sharp, or severe pain in your abdomen or lower back.  You have a fever.  You have swelling or pain in your legs.  You have difficulty breathing. Summary  After the procedure, it is common to have mild pain when you urinate that goes away within a few minutes after you urinate. This may last for up to 1 week.  Watch for any blood in your urine. Call your health care provider if the amount of blood in your urine increases.  Take over-the-counter and prescription medicines only as told by your health care provider.  Drink enough fluid to keep your urine pale yellow. This information is not intended to replace advice given to you by your health care provider. Make sure you discuss any questions you have with your health care provider. Document Revised: 10/25/2017 Document Reviewed: 10/26/2017 Elsevier Patient Education  2021 [employer] General Anesthesia, Adult, Care After This sheet gives you information about how to care for yourself after your procedure. Your health care provider may also give you more specific instructions. If you have problems or questions, contact your health care provider. What can I expect after the procedure? After the procedure, the following side effects are common:  Pain or discomfort at the IV  site.  Nausea.  Vomiting.  Sore throat.  Trouble concentrating.  Feeling cold or chills.  Feeling weak or tired.  Sleepiness and fatigue.  Soreness and body aches. These side effects can affect parts of the body that were not involved in surgery. Follow these instructions at home: For the time period you were told by your health care provider:  Rest.  Do not participate in activities where you could fall or become injured.  Do not drive or use machinery.  Do not drink alcohol.  Do not take sleeping pills or medicines that cause drowsiness.  Do not make important decisions or sign legal documents.  Do not take care of children on your own.   Eating and drinking  Follow any instructions from your health care provider about eating  or drinking restrictions.  When you feel hungry, start by eating small amounts of foods that are soft and easy to digest (bland), such as toast. Gradually return to your regular diet.  Drink enough fluid to keep your urine pale yellow.  If you vomit, rehydrate by drinking water, juice, or clear broth. General instructions  If you have sleep apnea, surgery and certain medicines can increase your risk for breathing problems. Follow instructions from your health care provider about wearing your sleep device: ? Anytime you are sleeping, including during daytime naps. ? While taking prescription pain medicines, sleeping medicines, or medicines that make you drowsy.  Have a responsible adult stay with you for the time you are told. It is important to have someone help care for you until you are awake and alert.  Return to your normal activities as told by your health care provider. Ask your health care provider what activities are safe for you.  Take over-the-counter and prescription medicines only as told by your health care provider.  If you smoke, do not smoke without supervision.  Keep all follow-up visits as told by your health care  provider. This is important. Contact a health care provider if:  You have nausea or vomiting that does not get better with medicine.  You cannot eat or drink without vomiting.  You have pain that does not get better with medicine.  You are unable to pass urine.  You develop a skin rash.  You have a fever.  You have redness around your IV site that gets worse. Get help right away if:  You have difficulty breathing.  You have chest pain.  You have blood in your urine or stool, or you vomit blood. Summary  After the procedure, it is common to have a sore throat or nausea. It is also common to feel tired.  Have a responsible adult stay with you for the time you are told. It is important to have someone help care for you until you are awake and alert.  When you feel hungry, start by eating small amounts of foods that are soft and easy to digest (bland), such as toast. Gradually return to your regular diet.  Drink enough fluid to keep your urine pale yellow.  Return to your normal activities as told by your health care provider. Ask your health care provider what activities are safe for you. This information is not intended to replace advice given to you by your health care provider. Make sure you discuss any questions you have with your health care provider. Document Revised: 10/04/2019 Document Reviewed: 05/03/2019 Elsevier Patient Education  2021 [employer].

## 2020-05-29 NOTE — Telephone Encounter (Signed)
I spoke with James Shelton at OR- patient will proceed with scheduled date of May 4th.

## 2020-05-29 NOTE — Telephone Encounter (Signed)
Received message from [facility] OR scheduler patient would not be able to have surgery done on May 4th.  I called patient and had to leave message to return call to reschedule surgery date.

## 2020-06-02 ENCOUNTER — Other Ambulatory Visit: Payer: Self-pay

## 2020-06-02 ENCOUNTER — Other Ambulatory Visit (HOSPITAL_COMMUNITY)
Admission: RE | Admit: 2020-06-02 | Discharge: 2020-06-02 | Disposition: A | Discharge status: Home / Self Care | Payer: Self-pay | Source: Ambulatory Visit | Attending: Urology | Admitting: Urology

## 2020-06-02 ENCOUNTER — Encounter (HOSPITAL_COMMUNITY)
Admission: RE | Admit: 2020-06-02 | Discharge: 2020-06-02 | Disposition: A | Discharge status: Home / Self Care | Payer: Self-pay | Source: Ambulatory Visit | Attending: Urology | Admitting: Urology

## 2020-06-02 DIAGNOSIS — Z01818 Encounter for other preprocedural examination: Secondary | ICD-10-CM | POA: Insufficient documentation

## 2020-06-02 DIAGNOSIS — Z20822 Contact with and (suspected) exposure to covid-19: Secondary | ICD-10-CM | POA: Insufficient documentation

## 2020-06-03 LAB — SARS CORONAVIRUS 2 (TAT 6-24 HRS): SARS Coronavirus 2: NEGATIVE

## 2020-06-04 ENCOUNTER — Encounter (HOSPITAL_COMMUNITY): Payer: Self-pay | Admitting: Urology

## 2020-06-04 ENCOUNTER — Encounter (HOSPITAL_COMMUNITY)
Admission: RE | Disposition: A | Discharge status: Home / Self Care | Payer: Self-pay | Source: Ambulatory Visit | Attending: Urology

## 2020-06-04 ENCOUNTER — Ambulatory Visit (HOSPITAL_COMMUNITY): Payer: Self-pay

## 2020-06-04 ENCOUNTER — Ambulatory Visit (HOSPITAL_COMMUNITY): Payer: Self-pay | Admitting: Certified Registered"

## 2020-06-04 ENCOUNTER — Ambulatory Visit (HOSPITAL_COMMUNITY)
Admission: RE | Admit: 2020-06-04 | Discharge: 2020-06-04 | Disposition: A | Discharge status: Home / Self Care | Payer: Self-pay | Source: Ambulatory Visit | Attending: Urology | Admitting: Urology

## 2020-06-04 DIAGNOSIS — Z888 Allergy status to other drugs, medicaments and biological substances status: Secondary | ICD-10-CM | POA: Insufficient documentation

## 2020-06-04 DIAGNOSIS — N201 Calculus of ureter: Secondary | ICD-10-CM

## 2020-06-04 DIAGNOSIS — Z87442 Personal history of urinary calculi: Secondary | ICD-10-CM | POA: Insufficient documentation

## 2020-06-04 DIAGNOSIS — F17210 Nicotine dependence, cigarettes, uncomplicated: Secondary | ICD-10-CM | POA: Insufficient documentation

## 2020-06-04 DIAGNOSIS — N132 Hydronephrosis with renal and ureteral calculous obstruction: Secondary | ICD-10-CM | POA: Insufficient documentation

## 2020-06-04 HISTORY — PX: HOLMIUM LASER APPLICATION: SHX5852

## 2020-06-04 HISTORY — PX: CYSTOSCOPY WITH RETROGRADE PYELOGRAM, URETEROSCOPY AND STENT PLACEMENT: SHX5789

## 2020-06-04 SURGERY — CYSTOURETEROSCOPY, WITH RETROGRADE PYELOGRAM AND STENT INSERTION
Anesthesia: General | Laterality: Bilateral

## 2020-06-04 MED ORDER — PROPOFOL 10 MG/ML IV BOLUS
INTRAVENOUS | Status: DC | PRN
  Administered 2020-06-04: 50 mg via INTRAVENOUS
  Administered 2020-06-04: 200 mg via INTRAVENOUS

## 2020-06-04 MED ORDER — LACTATED RINGERS IV SOLN: INTRAVENOUS | Status: DC

## 2020-06-04 MED ORDER — ONDANSETRON HCL 4 MG/2ML IJ SOLN
4.0000 mg | Freq: Once | INTRAMUSCULAR | Status: AC
  Administered 2020-06-04: 4 mg via INTRAVENOUS

## 2020-06-04 MED ORDER — SODIUM CHLORIDE 0.9 % IV SOLN
2.0000 g | INTRAVENOUS | Status: AC
  Administered 2020-06-04: 2 g via INTRAVENOUS

## 2020-06-04 MED ORDER — DIATRIZOATE MEGLUMINE 30 % UR SOLN
URETHRAL | Status: AC
  Filled 2020-06-04: qty 100

## 2020-06-04 MED ORDER — HYDROMORPHONE HCL 1 MG/ML IJ SOLN
0.2500 mg | INTRAMUSCULAR | Status: DC | PRN
  Administered 2020-06-04 (×4): 0.5 mg via INTRAVENOUS
  Filled 2020-06-04 (×4): qty 0.5

## 2020-06-04 MED ORDER — ONDANSETRON HCL 4 MG/2ML IJ SOLN
INTRAMUSCULAR | Status: AC
  Filled 2020-06-04: qty 2

## 2020-06-04 MED ORDER — BELLADONNA ALKALOIDS-OPIUM 16.2-60 MG RE SUPP
1.0000 | Freq: Once | RECTAL | Status: AC
  Administered 2020-06-04: 1 via RECTAL

## 2020-06-04 MED ORDER — SODIUM CHLORIDE 0.9 % IV SOLN
INTRAVENOUS | Status: AC
  Filled 2020-06-04: qty 20

## 2020-06-04 MED ORDER — DEXAMETHASONE SODIUM PHOSPHATE 4 MG/ML IJ SOLN
INTRAMUSCULAR | Status: DC | PRN
  Administered 2020-06-04: 5 mg via INTRAVENOUS

## 2020-06-04 MED ORDER — WATER FOR IRRIGATION, STERILE IR SOLN
Status: DC | PRN
  Administered 2020-06-04: 1000 mL via TOPICAL

## 2020-06-04 MED ORDER — DIATRIZOATE MEGLUMINE 30 % UR SOLN
URETHRAL | Status: DC | PRN
  Administered 2020-06-04: 17 mL via URETHRAL

## 2020-06-04 MED ORDER — BELLADONNA ALKALOIDS-OPIUM 16.2-30 MG RE SUPP
1.0000 | Freq: Once | RECTAL | Status: DC
Start: 2020-06-04 — End: 2020-06-04

## 2020-06-04 MED ORDER — DEXMEDETOMIDINE (PRECEDEX) IN NS 20 MCG/5ML (4 MCG/ML) IV SYRINGE
PREFILLED_SYRINGE | INTRAVENOUS | Status: AC
  Filled 2020-06-04: qty 5

## 2020-06-04 MED ORDER — LIDOCAINE HCL (PF) 2 % IJ SOLN
INTRAMUSCULAR | Status: AC
  Filled 2020-06-04: qty 5

## 2020-06-04 MED ORDER — MIDAZOLAM HCL 5 MG/5ML IJ SOLN
INTRAMUSCULAR | Status: DC | PRN
  Administered 2020-06-04: 2 mg via INTRAVENOUS

## 2020-06-04 MED ORDER — SODIUM CHLORIDE 0.9 % IR SOLN
Status: DC | PRN
  Administered 2020-06-04: 1000 mL

## 2020-06-04 MED ORDER — ORAL CARE MOUTH RINSE: 15.0000 mL | Freq: Once | OROMUCOSAL | Status: AC

## 2020-06-04 MED ORDER — DEXMEDETOMIDINE (PRECEDEX) IN NS 20 MCG/5ML (4 MCG/ML) IV SYRINGE
PREFILLED_SYRINGE | INTRAVENOUS | Status: DC | PRN
  Administered 2020-06-04 (×2): 20 ug via INTRAVENOUS

## 2020-06-04 MED ORDER — ONDANSETRON HCL 4 MG/2ML IJ SOLN
INTRAMUSCULAR | Status: DC | PRN
  Administered 2020-06-04: 4 mg via INTRAVENOUS

## 2020-06-04 MED ORDER — MIDAZOLAM HCL 2 MG/2ML IJ SOLN
INTRAMUSCULAR | Status: AC
  Filled 2020-06-04: qty 2

## 2020-06-04 MED ORDER — GLYCOPYRROLATE PF 0.2 MG/ML IJ SOSY
PREFILLED_SYRINGE | INTRAMUSCULAR | Status: AC
  Filled 2020-06-04: qty 1

## 2020-06-04 MED ORDER — CHLORHEXIDINE GLUCONATE 0.12 % MT SOLN
15.0000 mL | Freq: Once | OROMUCOSAL | Status: AC
  Administered 2020-06-04: 15 mL via OROMUCOSAL

## 2020-06-04 MED ORDER — FENTANYL CITRATE (PF) 100 MCG/2ML IJ SOLN
INTRAMUSCULAR | Status: DC | PRN
  Administered 2020-06-04 (×2): 50 ug via INTRAVENOUS

## 2020-06-04 MED ORDER — LIDOCAINE 2% (20 MG/ML) 5 ML SYRINGE
INTRAMUSCULAR | Status: DC | PRN
  Administered 2020-06-04: 100 mg via INTRAVENOUS

## 2020-06-04 MED ORDER — OXYCODONE-ACETAMINOPHEN 5-325 MG PO TABS: 1.0000 | ORAL_TABLET | ORAL | 0 refills | Status: AC | PRN

## 2020-06-04 MED ORDER — GLYCOPYRROLATE PF 0.2 MG/ML IJ SOSY
PREFILLED_SYRINGE | INTRAMUSCULAR | Status: DC | PRN
  Administered 2020-06-04: .1 mg via INTRAVENOUS

## 2020-06-04 MED ORDER — FENTANYL CITRATE (PF) 100 MCG/2ML IJ SOLN
INTRAMUSCULAR | Status: AC
  Filled 2020-06-04: qty 2

## 2020-06-04 SURGICAL SUPPLY — 26 items
BAG DRAIN URO TABLE W/ADPT NS (BAG) ×2 IMPLANT
BAG DRN 8 ADPR NS SKTRN CSTL (BAG) ×1
BAG HAMPER (MISCELLANEOUS) ×2 IMPLANT
CATH INTERMIT  6FR 70CM (CATHETERS) ×2 IMPLANT
CLOTH BEACON ORANGE TIMEOUT ST (SAFETY) ×2 IMPLANT
DECANTER SPIKE VIAL GLASS SM (MISCELLANEOUS) ×2 IMPLANT
EXTRACTOR STONE NITINOL NGAGE (UROLOGICAL SUPPLIES) ×2 IMPLANT
GLOVE BIO SURGEON STRL SZ8 (GLOVE) ×2 IMPLANT
GLOVE SURG UNDER POLY LF SZ7 (GLOVE) ×4 IMPLANT
GOWN STRL REUS W/TWL LRG LVL3 (GOWN DISPOSABLE) ×2 IMPLANT
GOWN STRL REUS W/TWL XL LVL3 (GOWN DISPOSABLE) ×2 IMPLANT
GUIDEWIRE STR DUAL SENSOR (WIRE) ×2 IMPLANT
GUIDEWIRE STR ZIPWIRE 035X150 (MISCELLANEOUS) ×2 IMPLANT
IV NS IRRIG 3000ML ARTHROMATIC (IV SOLUTION) ×4 IMPLANT
KIT TURNOVER CYSTO (KITS) ×2 IMPLANT
MANIFOLD NEPTUNE II (INSTRUMENTS) ×2 IMPLANT
PACK CYSTO (CUSTOM PROCEDURE TRAY) ×2 IMPLANT
PAD ARMBOARD 7.5X6 YLW CONV (MISCELLANEOUS) ×2 IMPLANT
STENT CONTOUR 6FRX26X.035 (STENTS) ×2 IMPLANT
STENT URET 6FRX26 CONTOUR (STENTS) ×2 IMPLANT
SYR 10ML LL (SYRINGE) ×2 IMPLANT
SYR CONTROL 10ML LL (SYRINGE) ×2 IMPLANT
TOWEL OR 17X26 4PK STRL BLUE (TOWEL DISPOSABLE) ×2 IMPLANT
TRACTIP FLEXIVA PULS ID 200XHI (Laser) ×1 IMPLANT
TRACTIP FLEXIVA PULSE ID 200 (Laser) ×2
WATER STERILE IRR 500ML POUR (IV SOLUTION) ×2 IMPLANT

## 2020-06-04 NOTE — Discharge Instructions (Signed)
Ureteral Stent Implantation, Care After This sheet gives you information about how to care for yourself after your procedure. Your health care provider may also give you more specific instructions. If you have problems or questions, contact your health care provider. What can I expect after the procedure? After the procedure, it is common to have:  Nausea.  Mild pain when you urinate. You may feel this pain in your lower back or lower abdomen. The pain should stop within a few minutes after you urinate. This may last for up to 1 week.  A small amount of blood in your urine for several days. Follow these instructions at home: Medicines  Take over-the-counter and prescription medicines only as told by your health care provider.  If you were prescribed an antibiotic medicine, take it as told by your health care provider. Do not stop taking the antibiotic even if you start to feel better.  Do not drive for 24 hours if you were given a sedative during your procedure.  Ask your health care provider if the medicine prescribed to you requires you to avoid driving or using heavy machinery. Activity  Rest as told by your health care provider.  Avoid sitting for a long time without moving. Get up to take short walks every 1-2 hours. This is important to improve blood flow and breathing. Ask for help if you feel weak or unsteady.  Return to your normal activities as told by your health care provider. Ask your health care provider what activities are safe for you. General instructions  Watch for any blood in your urine. Call your health care provider if the amount of blood in your urine increases.  If you have a catheter: ? Follow instructions from your health care provider about taking care of your catheter and collection bag. ? Do not take baths, swim, or use a hot tub until your health care provider approves. Ask your health care provider if you may take showers. You may only be allowed to  take sponge baths.  Drink enough fluid to keep your urine pale yellow.  Do not use any products that contain nicotine or tobacco, such as cigarettes, e-cigarettes, and chewing tobacco. These can delay healing after surgery. If you need help quitting, ask your health care provider.  Keep all follow-up visits as told by your health care provider. This is important.   Contact a health care provider if:  You have pain that gets worse or does not get better with medicine, especially pain when you urinate.  You have difficulty urinating.  You feel nauseous or you vomit repeatedly during a period of more than 2 days after the procedure. Get help right away if:  Your urine is dark red or has blood clots in it.  You are leaking urine (have incontinence).  The end of the stent comes out of your urethra.  You cannot urinate.  You have sudden, sharp, or severe pain in your abdomen or lower back.  You have a fever.  You have swelling or pain in your legs.  You have difficulty breathing. Summary  After the procedure, it is common to have mild pain when you urinate that goes away within a few minutes after you urinate. This may last for up to 1 week.  Watch for any blood in your urine. Call your health care provider if the amount of blood in your urine increases.  Take over-the-counter and prescription medicines only as told by your health care provider.  Drink enough fluid to keep your urine pale yellow. This information is not intended to replace advice given to you by your health care provider. Make sure you discuss any questions you have with your health care provider. Document Revised: 10/25/2017 Document Reviewed: 10/26/2017 Elsevier Patient Education  2021 Else[employer] Inc.  PLEASE REMOVE YOUR STENT IN 72 HOURS BY GENTLY PULLING THE STRING  PATIENT INSTRUCTIONS POST-ANESTHESIA  IMMEDIATELY FOLLOWING SURGERY:  Do not drive or operate machinery for the first twenty four hours  after surgery.  Do not make any important decisions for twenty four hours after surgery or while taking narcotic pain medications or sedatives.  If you develop intractable nausea and vomiting or a severe headache please notify your doctor immediately.  FOLLOW-UP:  Please make an appointment with your surgeon as instructed. You do not need to follow up with anesthesia unless specifically instructed to do so.  WOUND CARE INSTRUCTIONS (if applicable):  Keep a dry clean dressing on the anesthesia/puncture wound site if there is drainage.  Once the wound has quit draining you may leave it open to air.  Generally you should leave the bandage intact for twenty four hours unless there is drainage.  If the epidural site drains for more than 36-48 hours please call the anesthesia department.  QUESTIONS?:  Please feel free to call your physician or the hospital operator if you have any questions, and they will be happy to assist you.

## 2020-06-04 NOTE — Interval H&P Note (Signed)
History and Physical Interval Note:  06/04/2020 12:57 PM  James Shelton  has presented today for surgery, with the diagnosis of bilateral ureteral calculi.  The various methods of treatment have been discussed with the patient and family. After consideration of risks, benefits and other options for treatment, the patient has consented to  Procedure(s): CYSTOSCOPY WITH RETROGRADE PYELOGRAM, URETEROSCOPY AND STENT PLACEMENT (Bilateral) HOLMIUM LASER APPLICATION (Bilateral) as a surgical intervention.  The patient's history has been reviewed, patient examined, no change in status, stable for surgery.  I have reviewed the patient's chart and labs.  Questions were answered to the patient's satisfaction.     Wilkie Aye

## 2020-06-04 NOTE — Anesthesia Procedure Notes (Signed)
Procedure Name: LMA Insertion Date/Time: 06/04/2020 1:31 PM Performed by: Julian Reil, CRNA Pre-anesthesia Checklist: Patient identified, Emergency Drugs available, Suction available and Patient being monitored Patient Re-evaluated:Patient Re-evaluated prior to induction Oxygen Delivery Method: Circle system utilized Preoxygenation: Pre-oxygenation with 100% oxygen Induction Type: IV induction LMA: LMA inserted LMA Size: 4.0 Tube type: Oral Number of attempts: 1 Placement Confirmation: positive ETCO2 Tube secured with: Tape Dental Injury: Teeth and Oropharynx as per pre-operative assessment  Comments: Teeth very poor condition, many front upper missing, broken off.  Patient unaware if any are loose.

## 2020-06-04 NOTE — Progress Notes (Signed)
DR McKenzie in, pt in pain , Unable to void but feels he needs to. Bladder scan ordered by Dr Ronne Binning- 189 cc found, verbal order given for B&O suppository- ordered and called pharmacy.

## 2020-06-04 NOTE — Op Note (Signed)
.  Preoperative diagnosis: bilateral ureteral stone  Postoperative diagnosis: Same  Procedure: 1 cystoscopy 2. Bilateral retrograde pyelography 3.  Intraoperative fluoroscopy, under one hour, with interpretation 4.  Bilateral ureteroscopic stone manipulation with laser lithotripsy 5.  left 6 x 26 JJ stent placement  Attending: Wilkie Aye, MD  Anesthesia: General  Estimated blood loss: None  Drains: bilateral 6 x 26 JJ ureteral stent with tether  Specimens: stone for analysis  Antibiotics: Rocephin  Findings: bilateral distal ureteral stones. Mild bilateral hydronephrosis. No masses/lesions in the bladder. Ureteral orifices in normal anatomic location.  Indications: Patient is a 35 year old male with a history of bilateral distal ureteral stone who underwent stent placement 2 weeks ago. After discussing treatment options, they decided proceed with bilateral ureteroscopic stone manipulation.  Procedure in detail: The patient was brought to the operating room and a brief timeout was done to ensure correct patient, correct procedure, correct site.  General anesthesia was administered patient was placed in dorsal lithotomy position.  Her genitalia was then prepped and draped in usual sterile fashion.  A rigid 22 French cystoscope was passed in the urethra and the bladder.  Bladder was inspected free masses or lesions.  the ureteral orifices were in the normal orthotopic locations. Using a grasper the ureteral stent was brought to the urethral meatus. Through the stent a zipwire was advanced up to the renal pelvis. The stent was then removed. a 6 french ureteral catheter was then instilled into the left ureteral orifice.  a gentle retrograde was obtained and findings noted above.  we then removed the cystoscope and cannulated the left ureteral orifice with a semirigid ureteroscope.  We located the stone in the distal ureter which was fragmented with a 242nm laser fiber. The fragments removed  it with an Human resources officer. We then placed a 6 x 26 double-j ureteral stent over the original zip wire. We then removed the wire and good coil was noted in the the renal pelvis under fluoroscopy and the bladder under direct vision.  We then turned out attention to the right side. Using a grasper the ureteral stent was brought to the urethral meatus. Through the stent a zipwire was advanced up to the renal pelvis. The stent was then removed. a 6 french ureteral catheter was then instilled into the right ureteral orifice.  a gentle retrograde was obtained and findings noted above.  we then removed the cystoscope and cannulated the right ureteral orifice with a semirigid ureteroscope.  We located the stone in the distal ureter. The stone was then removed with a Ngage basket.   We elected not to place a stent since this was an uncomplicated ureteroscopy.  the bladder was then drained and this concluded the procedure which was well tolerated by patient.  Complications: None  Condition: Stable, extubated, transferred to PACU  Plan: Patient is to be discharged home as to follow-up in 2 weeks. He is to remove his stent in 72 hours by pulling the tether

## 2020-06-04 NOTE — Progress Notes (Signed)
Pt vomited moderate amount of clear light yellow emesis after moving from stretcher to w/c at 1724 Advised pt and girlfriend to take a zofran that he has at home per pt when he gets there and sip on gatorade or pedialyte. Advised if vomited two more times while taking zofran to come to ED for possible fluids.

## 2020-06-04 NOTE — Transfer of Care (Signed)
Immediate Anesthesia Transfer of Care Note  Patient: James Shelton  Procedure(s) Performed: CYSTOSCOPY WITH RETROGRADE PYELOGRAM, URETEROSCOPY AND STENT PLACEMENT (Bilateral ) HOLMIUM LASER APPLICATION (Bilateral )  Patient Location: PACU  Anesthesia Type:General  Level of Consciousness: drowsy  Airway & Oxygen Therapy: Patient Spontanous Breathing and Patient connected to face mask oxygen  Post-op Assessment: Report given to RN and Post -op Vital signs reviewed and stable  Post vital signs: Reviewed and stable  Last Vitals:  Vitals Value Taken Time  BP    Temp    Pulse    Resp    SpO2      Last Pain:  Vitals:   06/04/20 1229  TempSrc: Oral  PainSc: 3       Patients Stated Pain Goal: 8 (06/04/20 1229)  Complications: No complications documented.

## 2020-06-04 NOTE — Progress Notes (Signed)
Pt became nauseated after ambulated to bathroom. Dr Johnnette Litter called and vo for zofran iv 4mg  given , read back and verified.

## 2020-06-04 NOTE — Anesthesia Postprocedure Evaluation (Signed)
Anesthesia Post Note  Patient: James Shelton  Procedure(s) Performed: CYSTOSCOPY WITH RETROGRADE PYELOGRAM, URETEROSCOPY AND BILATERAL STENT REMOVAL , LEFT STENT PLACEMENT (Bilateral ) HOLMIUM LASER APPLICATION (Bilateral )  Patient location during evaluation: PACU Anesthesia Type: General Level of consciousness: awake and alert Pain management: pain level controlled Vital Signs Assessment: post-procedure vital signs reviewed and stable Respiratory status: spontaneous breathing, nonlabored ventilation, respiratory function stable and patient connected to nasal cannula oxygen Cardiovascular status: blood pressure returned to baseline and stable Postop Assessment: no apparent nausea or vomiting Anesthetic complications: no   No complications documented.   Last Vitals:  Vitals:   06/04/20 1545 06/04/20 1600  BP: 121/81 (!) 131/92  Pulse: (!) 52 (!) 58  Resp: (!) 23 14  Temp:    SpO2: 98% 100%    Last Pain:  Vitals:   06/04/20 1600  TempSrc:   PainSc: 7                  Windell Norfolk

## 2020-06-04 NOTE — Anesthesia Preprocedure Evaluation (Signed)
Anesthesia Evaluation  Patient identified by MRN, date of birth, ID band Patient awake    Reviewed: Allergy & Precautions, H&P , NPO status , Patient's Chart, lab work & pertinent test results, reviewed documented beta blocker date and time   Airway Mallampati: II  TM Distance: >3 FB Neck ROM: full    Dental no notable dental hx.    Pulmonary neg pulmonary ROS, Current Smoker,    Pulmonary exam normal breath sounds clear to auscultation       Cardiovascular Exercise Tolerance: Good negative cardio ROS   Rhythm:regular Rate:Normal     Neuro/Psych PSYCHIATRIC DISORDERS Depression Bipolar Disorder negative neurological ROS     GI/Hepatic negative GI ROS, Neg liver ROS,   Endo/Other  negative endocrine ROS  Renal/GU Renal disease  negative genitourinary   Musculoskeletal   Abdominal   Peds  Hematology negative hematology ROS (+)   Anesthesia Other Findings   Reproductive/Obstetrics negative OB ROS                             Anesthesia Physical  Anesthesia Plan  ASA: II  Anesthesia Plan: General   Post-op Pain Management:    Induction:   PONV Risk Score and Plan: Ondansetron  Airway Management Planned:   Additional Equipment:   Intra-op Plan:   Post-operative Plan:   Informed Consent: I have reviewed the patients History and Physical, chart, labs and discussed the procedure including the risks, benefits and alternatives for the proposed anesthesia with the patient or authorized representative who has indicated his/her understanding and acceptance.     Dental Advisory Given  Plan Discussed with: CRNA  Anesthesia Plan Comments:         Anesthesia Quick Evaluation

## 2020-06-05 ENCOUNTER — Encounter (HOSPITAL_COMMUNITY): Payer: Self-pay | Admitting: Urology

## 2020-06-10 LAB — CALCULI, WITH PHOTOGRAPH (CLINICAL LAB)
Calcium Oxalate Dihydrate: 90 %
Calcium Oxalate Monohydrate: 10 %
Weight Calculi: 40 mg

## 2020-06-11 ENCOUNTER — Ambulatory Visit: Payer: Self-pay | Admitting: Urology

## 2020-06-12 NOTE — Discharge Summary (Signed)
Physician Discharge Summary  Patient ID: James Shelton MRN: [id number] DOB/AGE: 06/04/1985 35 y.o.  Admit date: 05/22/2020 Discharge date: 05/23/2020 Admission Diagnoses:  Bilateral renal calculi  Discharge Diagnoses:  Active Problems:   Bilateral ureteral calculi   Past Medical History:  Diagnosis Date  . Bipolar 1 disorder (HCC)   . Depression   . Gout   . Hydronephrosis   . Kidney stone     Surgeries: Procedure(s): CYSTOSCOPY WITH RETROGRADE PYELOGRAM/URETERAL STENT PLACEMENT on 05/22/2020   Consultants (if any):   Discharged Condition: Improved  Hospital Course: James Shelton is an 35 y.o. male who was admitted 05/22/2020 with a diagnosis of <principal problem not specified> and went to the operating room on 05/22/2020 and underwent the above named procedures.    He was given perioperative antibiotics:  Anti-infectives (From admission, onward)   Start     Dose/Rate Route Frequency Ordered Stop   05/23/20 0800  cefTRIAXone (ROCEPHIN) 2 g in sodium chloride 0.9 % 100 mL IVPB  Status:  Discontinued        2 g 200 mL/hr over 30 Minutes Intravenous Every 24 hours 05/22/20 1509 05/23/20 1623   05/23/20 0000  sulfamethoxazole-trimethoprim (BACTRIM DS) 800-160 MG tablet        1 tablet Oral 2 times daily 05/23/20 0840     05/22/20 1130  cefTRIAXone (ROCEPHIN) 1 g in sodium chloride 0.9 % 100 mL IVPB        1 g 200 mL/hr over 30 Minutes Intravenous  Once 05/22/20 1119 05/22/20 1309   05/22/20 0600  cefTRIAXone (ROCEPHIN) 1 g in sodium chloride 0.9 % 100 mL IVPB        1 g 200 mL/hr over 30 Minutes Intravenous  Once 05/22/20 0558 05/22/20 1113    .  He was given sequential compression devices, early ambulation for DVT prophylaxis.  He benefited maximally from the hospital stay and there were no complications.    Recent vital signs:  Vitals:   05/22/20 2120 05/23/20 0818  BP: 109/81 136/85  Pulse: (!) 46 63  Resp: 18 18  Temp: 97.9 F (36.6 C) (!) 97.4 F (36.3 C)   SpO2: 100% 98%    Recent laboratory studies:  Lab Results  Component Value Date   HGB 15.2 05/24/2020   HGB 14.8 05/23/2020   HGB 16.6 05/22/2020   Lab Results  Component Value Date   WBC 10.2 05/24/2020   PLT 245 05/24/2020   No results found for: INR Lab Results  Component Value Date   NA 138 05/24/2020   K 3.9 05/24/2020   CL 105 05/24/2020   CO2 24 05/24/2020   BUN 12 05/24/2020   CREATININE 1.22 05/24/2020   GLUCOSE 104 (H) 05/24/2020    Discharge Medications:   Allergies as of 05/23/2020      Reactions   Depakote [valproic Acid]       Medication List    STOP taking these medications   oxyCODONE-acetaminophen 5-325 MG tablet Commonly known as: Percocet     TAKE these medications   ondansetron 4 MG tablet Commonly known as: ZOFRAN Take 1 tablet (4 mg total) by mouth every 8 (eight) hours as needed for nausea or vomiting. What changed: when to take this   phenazopyridine 100 MG tablet Commonly known as: Pyridium Take 1 tablet (100 mg total) by mouth 3 (three) times daily as needed for pain.   sulfamethoxazole-trimethoprim 800-160 MG tablet Commonly known as: BACTRIM DS Take 1 tablet by mouth  2 (two) times daily.   tamsulosin 0.4 MG Caps capsule Commonly known as: FLOMAX Take 1 capsule (0.4 mg total) by mouth daily.       Diagnostic Studies: CT ABDOMEN PELVIS W CONTRAST  Result Date: 05/25/2020 CLINICAL DATA:  Nonlocalized acute abdominal pain. Bilateral ureteral stent placements. EXAM: CT ABDOMEN AND PELVIS WITH CONTRAST TECHNIQUE: Multidetector CT imaging of the abdomen and pelvis was performed using the standard protocol following bolus administration of intravenous contrast. CONTRAST:  OMNIPAQUE IOHEXOL 300 MG/ML  SOLN COMPARISON:  None. FINDINGS: Lower chest: No acute abnormality. Hepatobiliary: No focal liver abnormality. Layering hyperdensity within the gallbladder lumen. Otherwise no gallbladder wall thickening or pericholecystic fluid.  No biliary dilatation. Pancreas: No focal lesion. Normal pancreatic contour. No surrounding inflammatory changes. No main pancreatic ductal dilatation. Spleen: Normal in size without focal abnormality. Adrenals/Urinary Tract: No adrenal nodule bilaterally. Bilateral ureteral stents with proximal pigtails within the renal pelvis and distal pigtails within the urinary bladder lumen. Slightly delayed right nephrogram. Urothelial thickening of the right collecting system. Slight associated peri-ureteral stranding. No hydronephrosis. Trace right hydroureter. No left hydroureter. No abscess formation. The urinary bladder is unremarkable. On delayed imaging, there is no urothelial wall thickening and there are no filling defects in the opacified portions of the bilateral collecting systems or ureters. Stomach/Bowel: Stomach is within normal limits. No evidence of bowel wall thickening or dilatation. Appendix appears normal. Vascular/Lymphatic: No abdominal aorta or iliac aneurysm. No abdominal, pelvic, or inguinal lymphadenopathy. Reproductive: Prostate is unremarkable. Other: No intraperitoneal free fluid. No intraperitoneal free gas. No organized fluid collection. Musculoskeletal: No abdominal wall hernia or abnormality. No suspicious lytic or blastic osseous lesions. No acute displaced fracture. IMPRESSION: 1. Bilateral ureteral stents in grossly appropriate position with a slightly delayed right nephrogram and suggestion of right collecting system infection. No abscess formation. Correlate with urinalysis. 2. Cholelithiasis with no findings of acute cholecystitis or choledocholithiasis. Electronically Signed   By: Tish Frederickson M.D.   On: 05/25/2020 01:34   DG C-Arm 1-60 Min-No Report  Result Date: 06/04/2020 Fluoroscopy was utilized by the requesting physician.  No radiographic interpretation.   DG C-Arm 1-60 Min-No Report  Result Date: 05/22/2020 Fluoroscopy was utilized by the requesting physician.  No  radiographic interpretation.   CT Renal Stone Study  Result Date: 05/22/2020 CLINICAL DATA:  Right-sided flank pain.  History of kidney stones EXAM: CT ABDOMEN AND PELVIS WITHOUT CONTRAST TECHNIQUE: Multidetector CT imaging of the abdomen and pelvis was performed following the standard protocol without IV contrast. 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 Hepatobiliary: No focal liver abnormality.No evidence of biliary obstruction or stone. Pancreas: Unremarkable. Spleen: Unremarkable. Adrenals/Urinary Tract: Negative adrenals. Progression of bilateral nephrolithiasis. A 7 x 4 mm stone is seen at the left UVJ with moderate hydroureteronephrosis. New right hydroureteronephrosis due to a 5 mm stone along the right pelvic sidewall. Punctate left lower pole calculus. Unremarkable bladder Stomach/Bowel:  No obstruction. No visible bowel inflammation. Vascular/Lymphatic: No acute vascular abnormality. No mass or adenopathy. Reproductive:No pathologic findings. Other: No ascites or pneumoperitoneum. Simple lipoma deep to the right external oblique Musculoskeletal: No acute abnormalities. IMPRESSION: Bilateral hydroureteronephrosis due to ureteral calculi. On the left a 7 x 4 mm stone is present at the UVJ and on the right a 5 mm stone is present at the right pelvic sidewall. Electronically Signed   By: Marnee Spring M.D.   On: 05/22/2020 06:33    Disposition: Discharge disposition:  01-Home or Self Care       Discharge Instructions    Discharge patient   Complete by: As directed    Discharge disposition: 01-Home or Self Care   Discharge patient date: 05/23/2020       Follow-up Information    Kelbi Renstrom, Mardene Celeste, MD. Call in 2 week(s).   Specialty: Urology Contact information: [address]  [place] [address] [phone]                Signed: Wilkie Aye 06/12/2020, 12:25 PM

## 2022-11-02 IMAGING — CT CT RENAL STONE PROTOCOL
2 of 4 series · 16 of 46 positions shown, 18 images · non-contrast
Comparison: None.

CLINICAL DATA: Left flank pain.

EXAM:
CT ABDOMEN AND PELVIS WITHOUT CONTRAST
TECHNIQUE: Multidetector CT imaging of the abdomen and pelvis was performed
following the standard protocol without IV contrast.

[Series 2: axial st · axial · 0.68mm/px · z∈[+1100,+1505]mm · 13 of 95 slices shown, 15 images]
[im 7/95  soft-tissue]
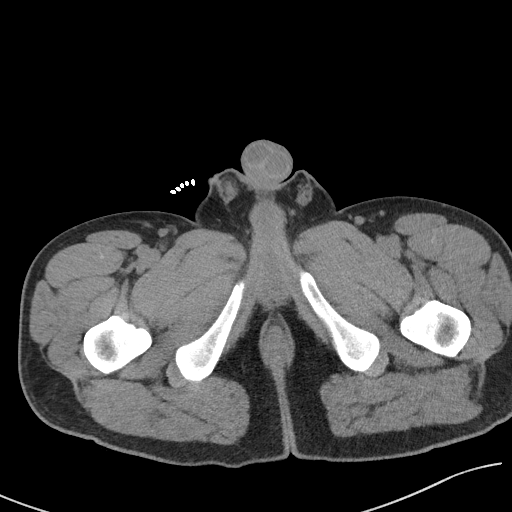
[im 7/95  bone]
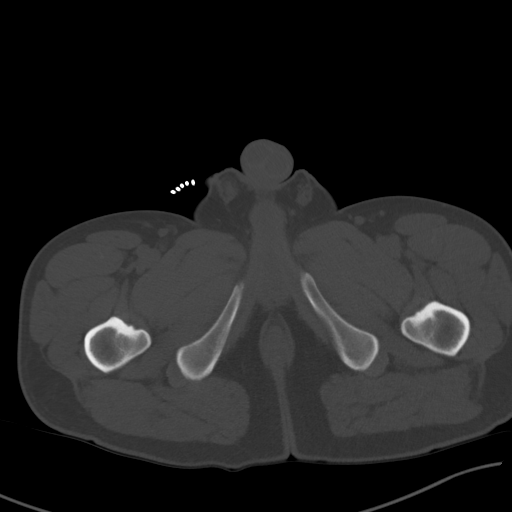
[im 13/95  soft-tissue]
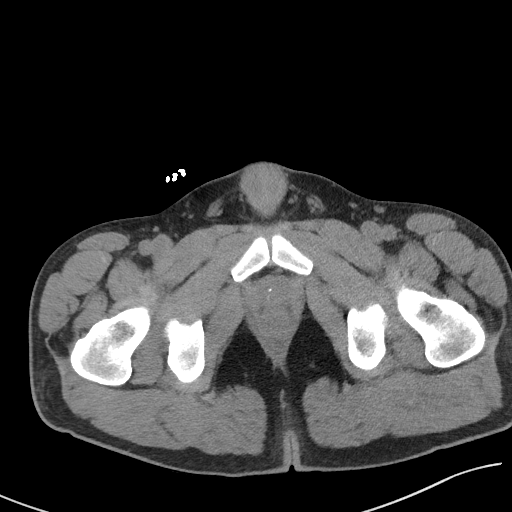
[im 19/95  soft-tissue]
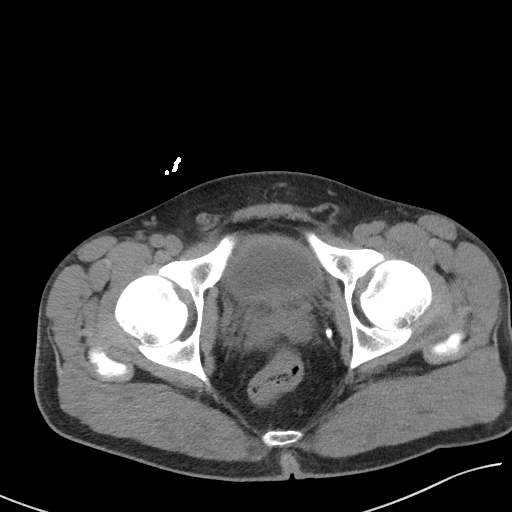
[im 26/95  soft-tissue]
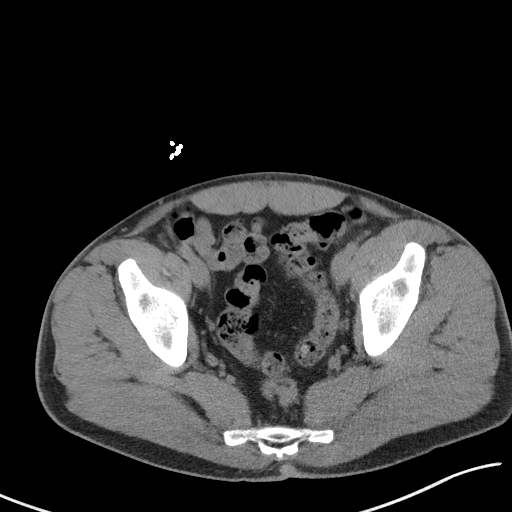
[im 32/95  soft-tissue]
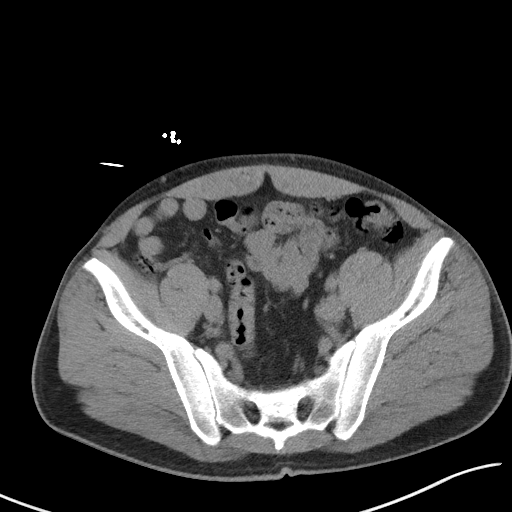
[im 38/95  soft-tissue]
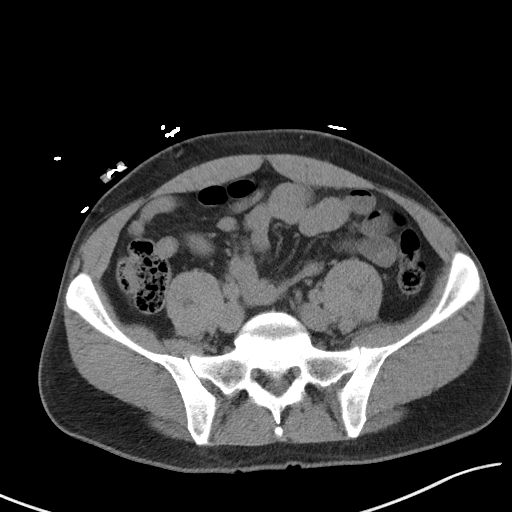
[im 51/95  soft-tissue]
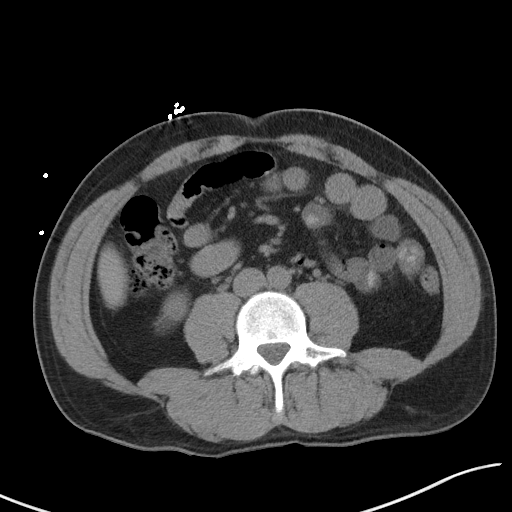
[im 57/95  soft-tissue]
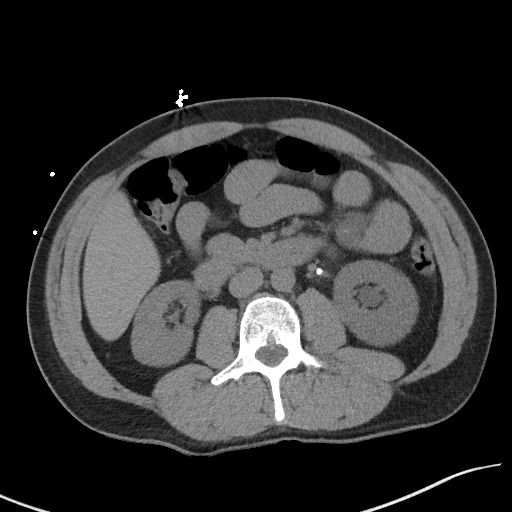
[im 63/95  soft-tissue]
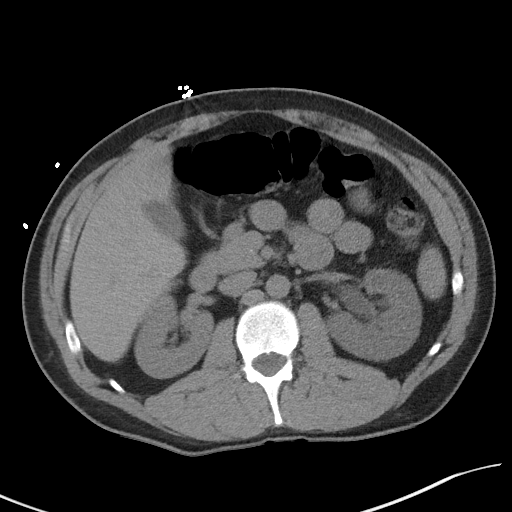
[im 63/95  bone]
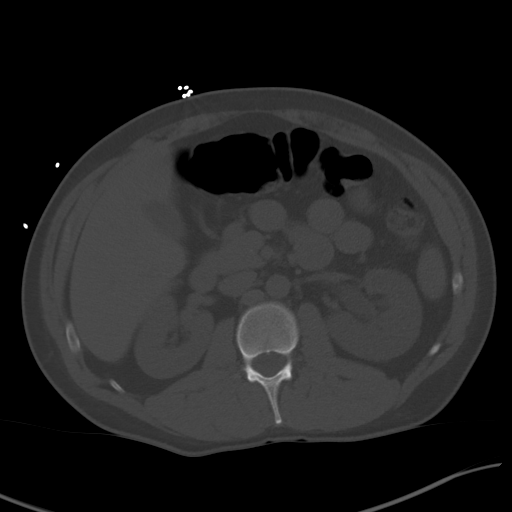
[im 69/95  soft-tissue]
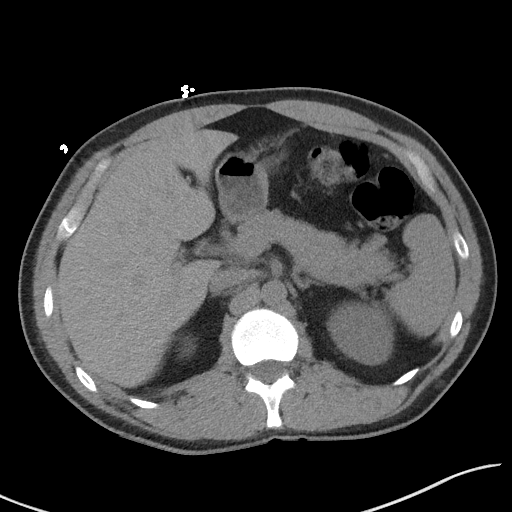
[im 76/95  soft-tissue]
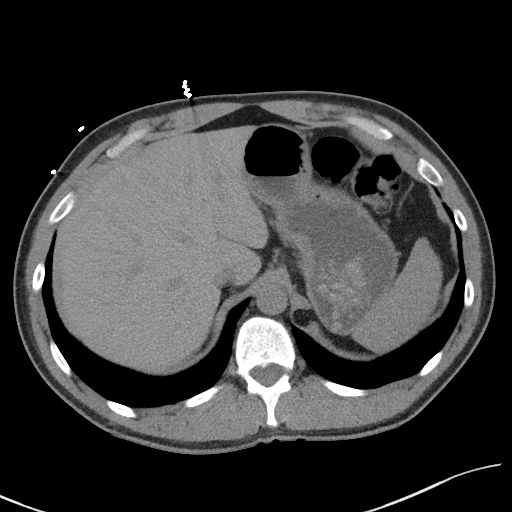
[im 82/95  soft-tissue]
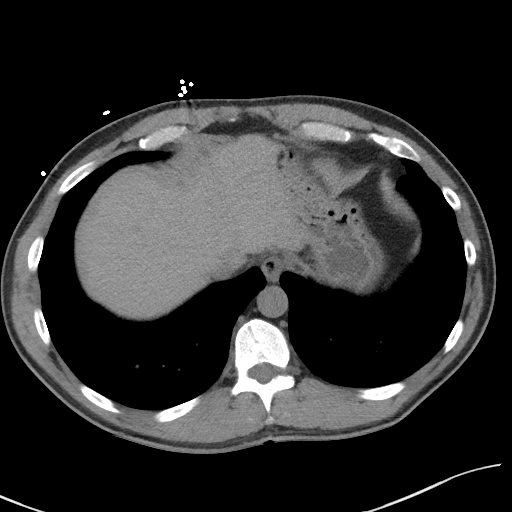
[im 88/95  soft-tissue]
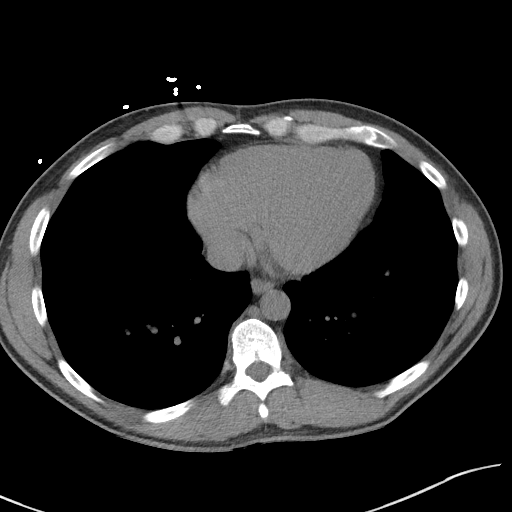

[Series 5: coronal st · coronal · 0.79mm/px · 3 of 80 slices shown]
[im 27/80  soft-tissue]
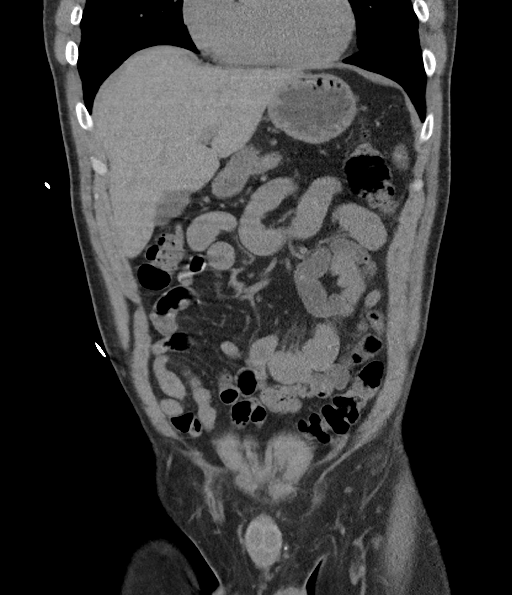
[im 36/80  soft-tissue]
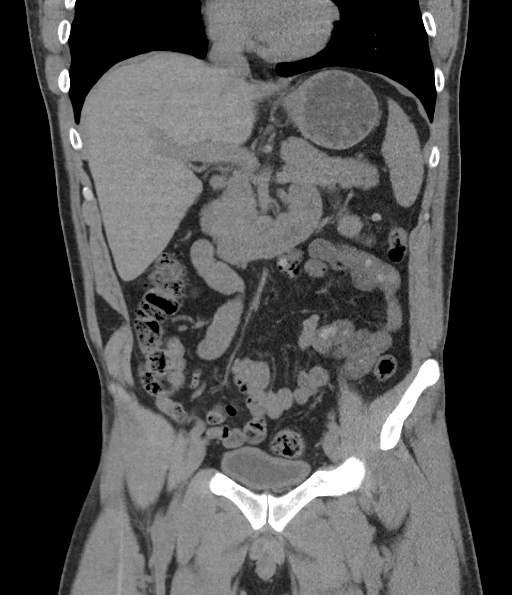
[im 44/80  soft-tissue]
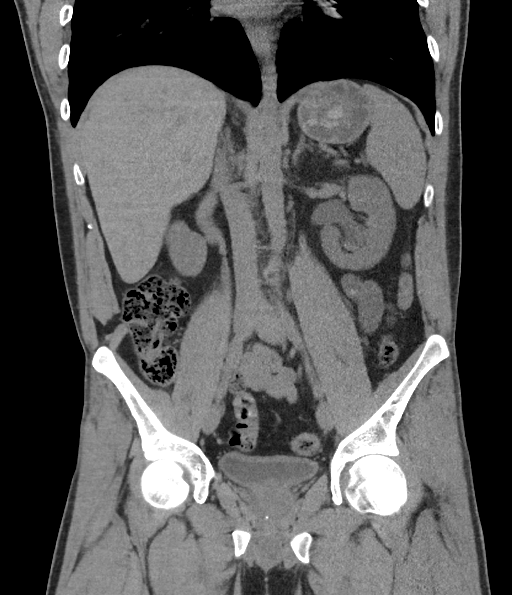

[16 of 46 positions shown; findings below may reference images not displayed]

FINDINGS: Lower chest: No acute abnormality.

Hepatobiliary: No focal liver abnormality is seen. No gallstones,
gallbladder wall thickening, or biliary dilatation.

Pancreas: Unremarkable. No pancreatic ductal dilatation or
surrounding inflammatory changes.

Spleen: Normal in size without focal abnormality.

Adrenals/Urinary Tract: Adrenal glands are unremarkable. Kidneys are
normal in size without focal lesions. A 4 mm obstructing renal stone
is seen within the proximal left ureter with moderate severity
left-sided hydronephrosis. A 3 mm calcification is seen projecting
over the expected region of the mid right ureter, without associated
hydronephrosis or hydroureter (axial CT image 48, CT series number
2). Bladder is unremarkable.

Stomach/Bowel: Stomach is within normal limits. Appendix appears
normal. No evidence of bowel wall thickening, distention, or
inflammatory changes. A solitary noninflamed diverticulum is seen
within the proximal descending colon.

Vascular/Lymphatic: No significant vascular findings are present. No
enlarged abdominal or pelvic lymph nodes.

Reproductive: Prostate is unremarkable.

Other: No abdominal wall hernia or abnormality. No abdominopelvic
ascites.

Musculoskeletal: No acute or significant osseous findings.
IMPRESSION: 1. 4 mm obstructing renal stone within the proximal left ureter.
2. Additional 3 mm renal stone suspected within the mid right
ureter, as described above.

## 2022-11-15 IMAGING — CT CT ABD-PELV W/ CM
2 of 5 series · 15 of 46 positions shown, 17 images · IV contrast (Omnipaque or Isovue)
Comparison: None.

CLINICAL DATA: Nonlocalized acute abdominal pain. Bilateral
ureteral stent placements.

EXAM:
CT ABDOMEN AND PELVIS WITH CONTRAST
TECHNIQUE: Multidetector CT imaging of the abdomen and pelvis was performed
using the standard protocol following bolus administration of
intravenous contrast.
CONTRAST:  100mL OMNIPAQUE IOHEXOL 300 MG/ML  SOLN

[Series 2: axial st · axial · 0.76mm/px · z∈[-291,+199]mm · 12 of 112 slices shown, 14 images]
[im 7/112  soft-tissue]
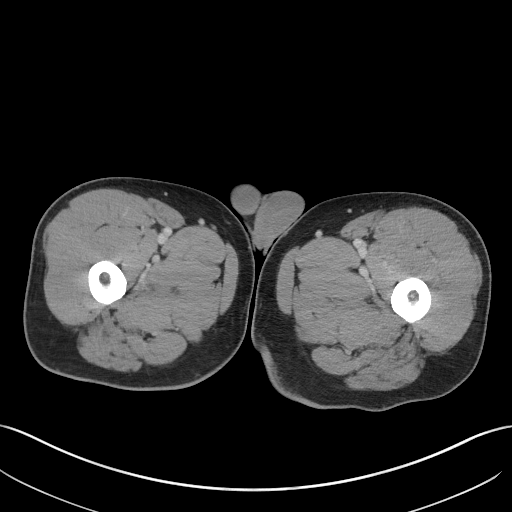
[im 7/112  bone]
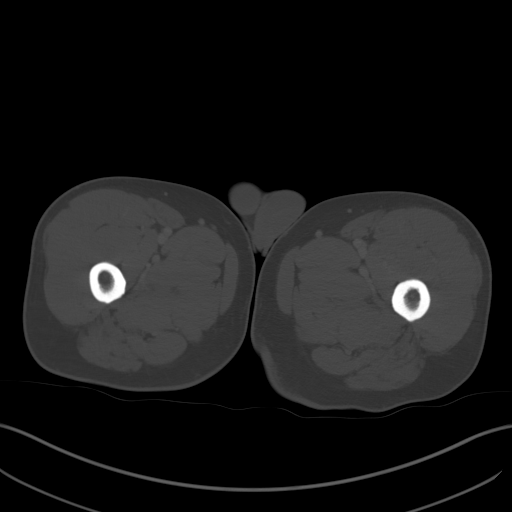
[im 19/112  soft-tissue]
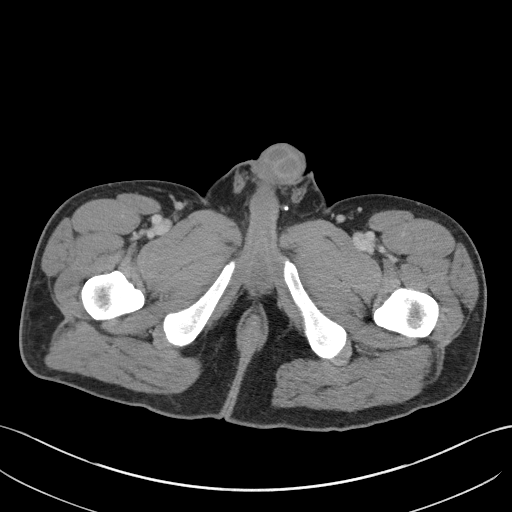
[im 25/112  soft-tissue]
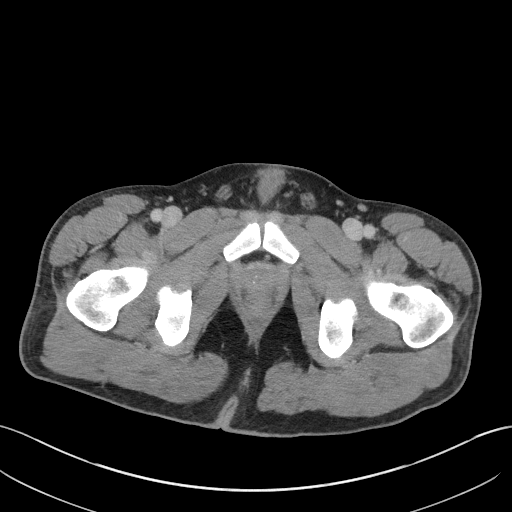
[im 31/112  soft-tissue]
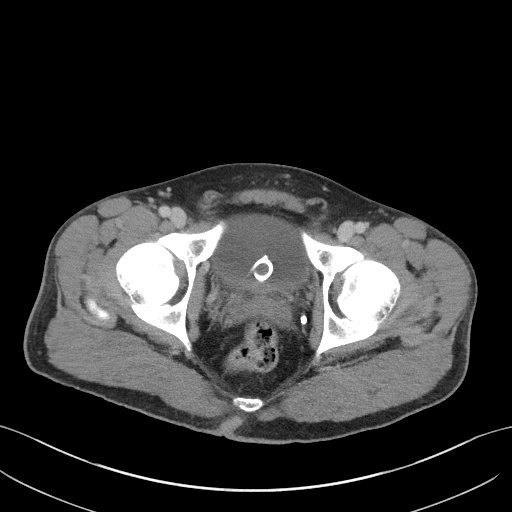
[im 44/112  soft-tissue]
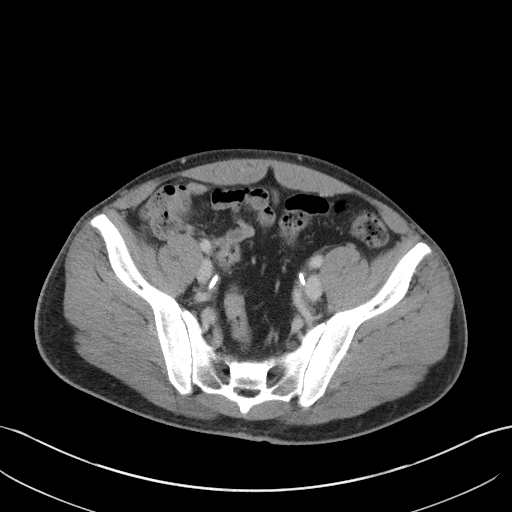
[im 50/112  soft-tissue]
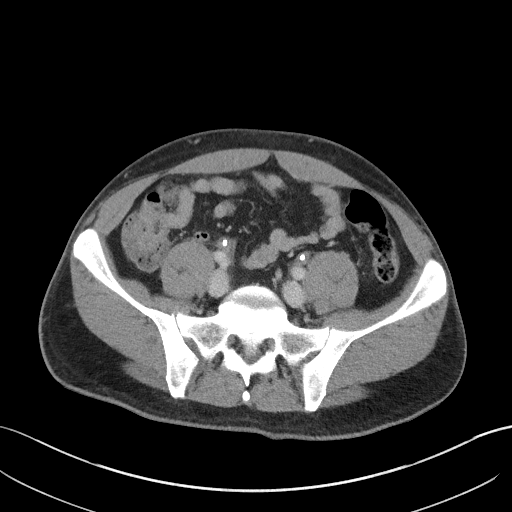
[im 62/112  soft-tissue]
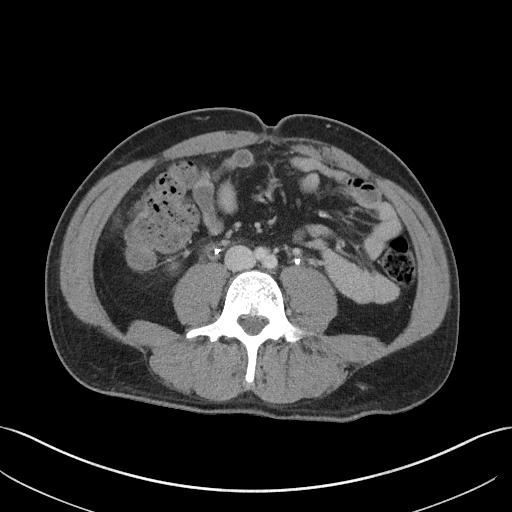
[im 68/112  soft-tissue]
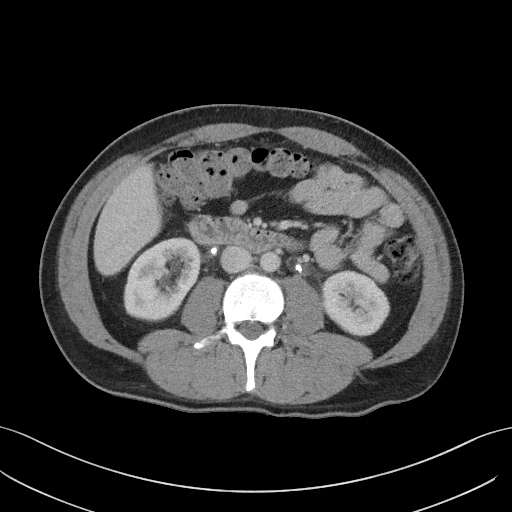
[im 81/112  soft-tissue]
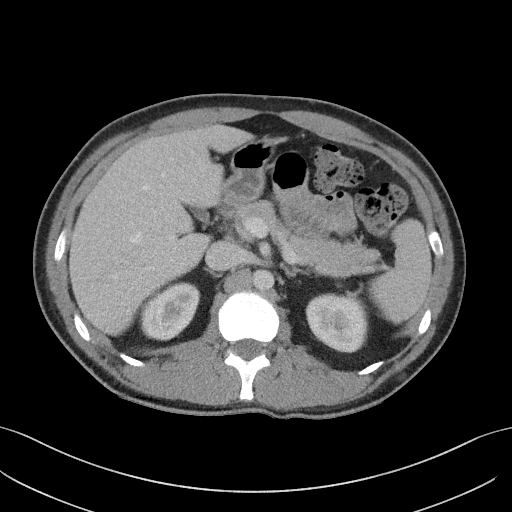
[im 81/112  bone]
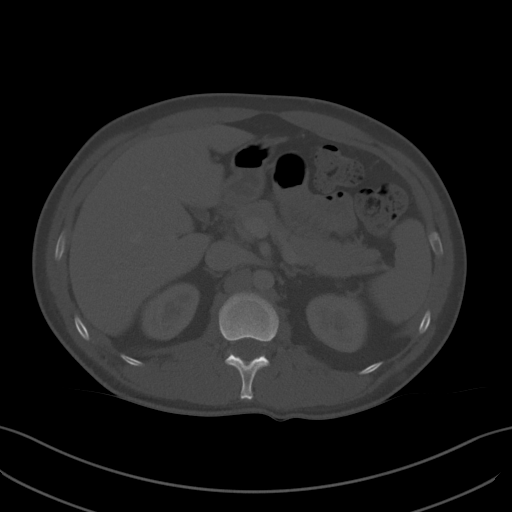
[im 87/112  soft-tissue]
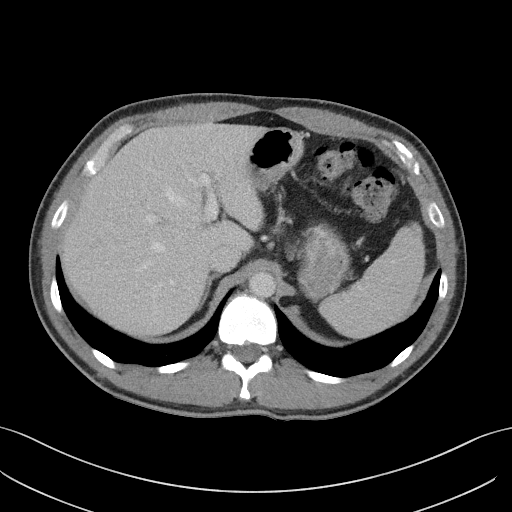
[im 93/112  soft-tissue]
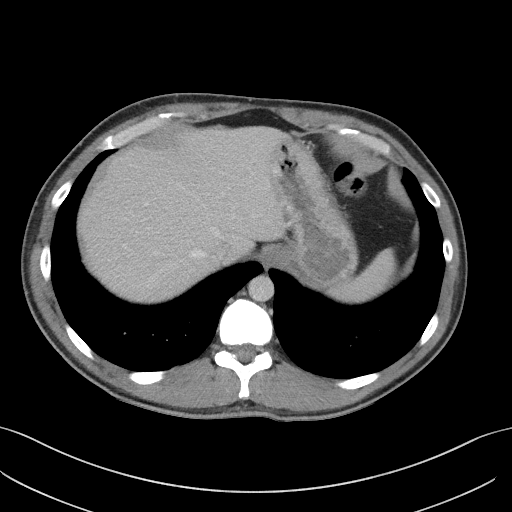
[im 105/112  soft-tissue]
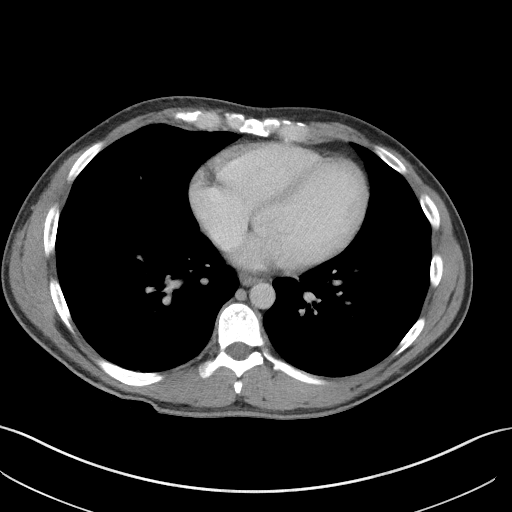

[Series 4: coronal st · coronal · 0.83mm/px · 3 of 99 slices shown]
[im 33/99  soft-tissue]
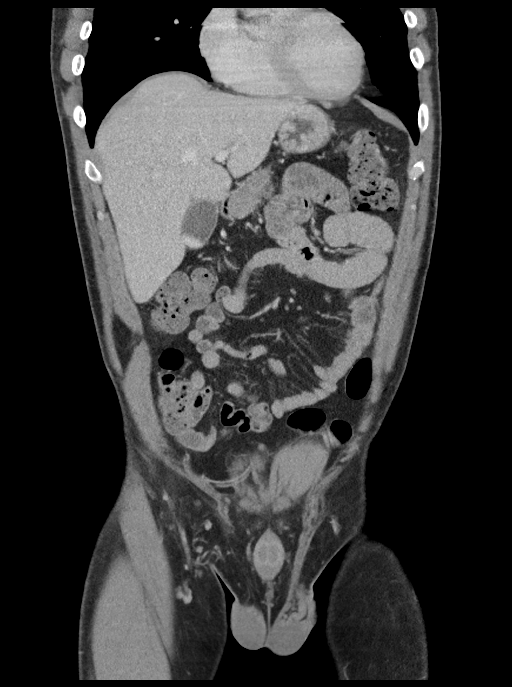
[im 44/99  soft-tissue]
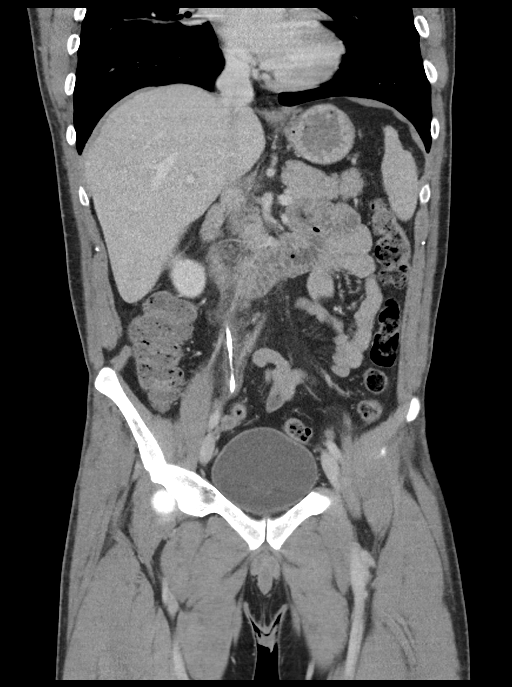
[im 55/99  soft-tissue]
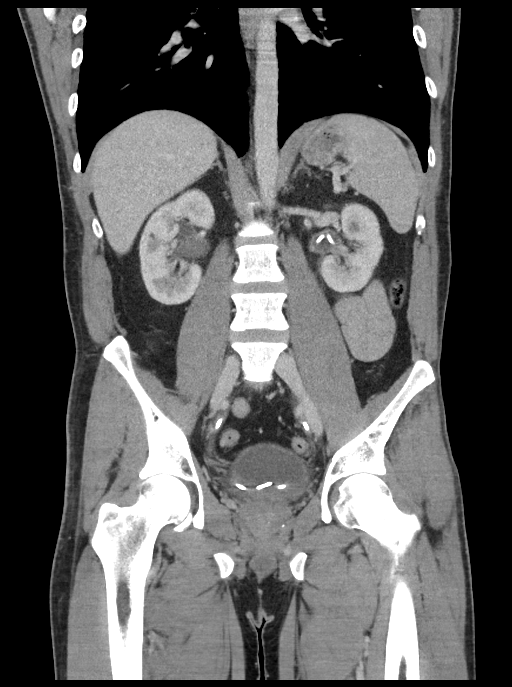

[15 of 46 positions shown; findings below may reference images not displayed]

FINDINGS: Lower chest: No acute abnormality.

Hepatobiliary: No focal liver abnormality. Layering hyperdensity
within the gallbladder lumen. Otherwise no gallbladder wall
thickening or pericholecystic fluid. No biliary dilatation.

Pancreas: No focal lesion. Normal pancreatic contour. No surrounding
inflammatory changes. No main pancreatic ductal dilatation.

Spleen: Normal in size without focal abnormality.

Adrenals/Urinary Tract:

No adrenal nodule bilaterally.

Bilateral ureteral stents with proximal pigtails within the renal
pelvis and distal pigtails within the urinary bladder lumen.
Slightly delayed right nephrogram. Urothelial thickening of the
right collecting system. Slight associated peri-ureteral stranding.
No hydronephrosis. Trace right hydroureter. No left hydroureter. No
abscess formation.

The urinary bladder is unremarkable.

On delayed imaging, there is no urothelial wall thickening and there
are no filling defects in the opacified portions of the bilateral
collecting systems or ureters.

Stomach/Bowel: Stomach is within normal limits. No evidence of bowel
wall thickening or dilatation. Appendix appears normal.

Vascular/Lymphatic: No abdominal aorta or iliac aneurysm. No
abdominal, pelvic, or inguinal lymphadenopathy.

Reproductive: Prostate is unremarkable.

Other: No intraperitoneal free fluid. No intraperitoneal free gas.
No organized fluid collection.

Musculoskeletal:

No abdominal wall hernia or abnormality.

No suspicious lytic or blastic osseous lesions. No acute displaced
fracture.
IMPRESSION: 1. Bilateral ureteral stents in grossly appropriate position with a
slightly delayed right nephrogram and suggestion of right collecting
system infection. No abscess formation. Correlate with urinalysis.
2. Cholelithiasis with no findings of acute cholecystitis or
choledocholithiasis.
